# Patient Record
Sex: Male | Born: 1960
Health system: Southern US, Community
[De-identification: ages and names within clinical notes are randomized; demographics above are authoritative.]

## PROBLEM LIST (undated history)

## (undated) DIAGNOSIS — M609 Myositis, unspecified: Secondary | ICD-10-CM

## (undated) DIAGNOSIS — E785 Hyperlipidemia, unspecified: Secondary | ICD-10-CM

## (undated) HISTORY — DX: Hyperlipidemia, unspecified: E78.5

## (undated) HISTORY — DX: Myositis, unspecified: M60.9

## (undated) HISTORY — PX: APPENDECTOMY: SHX54

---

## 2005-03-07 ENCOUNTER — Ambulatory Visit: Payer: Self-pay | Admitting: Sports Medicine

## 2011-06-28 ENCOUNTER — Ambulatory Visit (INDEPENDENT_AMBULATORY_CARE_PROVIDER_SITE_OTHER): Payer: Managed Care, Other (non HMO) | Admitting: Physician Assistant

## 2011-06-28 VITALS — BP 133/70 | HR 95 | Temp 100.5°F | Resp 16 | Ht 70.5 in | Wt 205.2 lb

## 2011-06-28 DIAGNOSIS — J40 Bronchitis, not specified as acute or chronic: Secondary | ICD-10-CM

## 2011-06-28 DIAGNOSIS — R059 Cough, unspecified: Secondary | ICD-10-CM

## 2011-06-28 DIAGNOSIS — R509 Fever, unspecified: Secondary | ICD-10-CM

## 2011-06-28 DIAGNOSIS — R05 Cough: Secondary | ICD-10-CM

## 2011-06-28 LAB — POCT INFLUENZA A/B
Influenza A, POC: NEGATIVE
Influenza B, POC: NEGATIVE

## 2011-06-28 MED ORDER — AZITHROMYCIN 250 MG PO TABS: ORAL_TABLET | ORAL | Status: AC

## 2011-06-28 MED ORDER — HYDROCODONE-HOMATROPINE 5-1.5 MG/5ML PO SYRP: ORAL_SOLUTION | ORAL | Status: AC

## 2011-06-28 MED ORDER — IPRATROPIUM BROMIDE 0.06 % NA SOLN: 2.0000 | Freq: Three times a day (TID) | NASAL | Status: DC

## 2011-06-28 NOTE — Progress Notes (Signed)
Patient ID: Eric Martinez MRN: [id number], DOB: September 28, 1960, 51 y.o. Date of Encounter: 06/28/2011, 11:25 AM  Primary Physician: Juline Patch, MD, MD  Chief Complaint:  Chief Complaint  Patient presents with  . Fever    x 5 days  . Cough    dark phlegm  . Sore Throat    body ache    HPI: 51 y.o. year old male presents with a 5 day history of nasal congestion, post nasal drip, sore throat, and cough. Mild sinus pressure. Subjective fever and chills over the past two day. Initially without fever. Nasal congestion thick and green/yellow. Cough is productive of green/yellow sputum and worse in the morning. Ears feel full, leading to sensation of muffled hearing. Has tried OTC cold preps without success. No GI complaints. Appetite normal.  No sick contacts, recent antibiotics, or recent travels.   No leg trauma, sedentary periods, h/o cancer, or tobacco use.  No past medical history on file.   Home Meds: Prior to Admission medications   Medication Sig Start Date End Date Taking? Authorizing Provider  azithromycin (ZITHROMAX Z-PAK) 250 MG tablet 2 tabs po first day, then 1 tab po next 4 days 06/28/11 07/03/11  Raymon Mutton Raymundo Rout, PA-C  HYDROcodone-homatropine [facility]) 5-1.5 MG/5ML syrup 1 TSP PO Q 4-6 HOURS PRN COUGH 06/28/11 07/08/11  Steve Youngberg M Lindsie Simar, PA-C  ipratropium (ATROVENT) 0.06 % nasal spray Place 2 sprays into the nose 3 (three) times daily. 06/28/11 06/27/12  Sondra Barges, PA-C    Allergies: No Known Allergies  History   Social History  . Marital Status: Married    Spouse Name: N/A    Number of Children: N/A  . Years of Education: N/A   Occupational History  . Not on file.   Social History Main Topics  . Smoking status: Never Smoker   . Smokeless tobacco: Not on file  . Alcohol Use: Not on file  . Drug Use: Not on file  . Sexually Active: Not on file   Other Topics Concern  . Not on file   Social History Narrative  . No narrative on file     Review of  Systems: Constitutional: negative for night sweats or weight changes Cardiovascular: negative for chest pain or palpitations Respiratory: negative for hemoptysis, wheezing, or shortness of breath Abdominal: negative for abdominal pain, nausea, vomiting or diarrhea Dermatological: negative for rash Neurologic: negative for headache   Physical Exam: Blood pressure 133/70, pulse 95, temperature 100.5 F (38.1 C), temperature source Oral, resp. rate 16, height 5' 10.5" (1.791 m), weight 205 lb 3.2 oz (93.078 kg)., Body mass index is 29.03 kg/(m^2). General: Well developed, well nourished, in no acute distress. Head: Normocephalic, atraumatic, eyes without discharge, sclera non-icteric, nares are congested. Bilateral auditory canals clear, TM's are without perforation, pearly grey with reflective cone of light bilaterally. No sinus TTP. Oral cavity moist, dentition normal. Posterior pharynx with post nasal drip and mild erythema. No peritonsillar abscess or tonsillar exudate. Neck: Supple. No thyromegaly. Full ROM. No lymphadenopathy. Lungs: Coarse breath sounds bilaterally without wheezes, rales, or rhonchi. Breathing is unlabored.  Heart: RRR with S1 S2. No murmurs, rubs, or gallops appreciated. Msk:  Strength and tone normal for age. Extremities: No clubbing or cyanosis. No edema. Neuro: Alert and oriented X 3. Moves all extremities spontaneously. CNII-XII grossly in tact. Psych:  Responds to questions appropriately with a normal affect.   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 -Azithromycin 250 MG #6 2 po first day then 1 po next 4 days no RF -Atrovent NS 0.06% 2 sprays each nare bid prn #1 no RF -Hycodan #4oz 1 tsp po q 4-6 hours prn cough no RF SED -Mucinex -Tylenol/Motrin  prn -Rest/fluids -RTC precautions -RTC 3-5 days if no improvement  Signed, Eula Listen, PA-C 06/28/2011 11:25 AM

## 2012-12-01 ENCOUNTER — Ambulatory Visit (INDEPENDENT_AMBULATORY_CARE_PROVIDER_SITE_OTHER): Payer: Managed Care, Other (non HMO) | Admitting: Emergency Medicine

## 2012-12-01 VITALS — BP 120/80 | HR 70 | Temp 98.5°F | Resp 17 | Ht 70.5 in | Wt 204.0 lb

## 2012-12-01 DIAGNOSIS — IMO0002 Reserved for concepts with insufficient information to code with codable children: Secondary | ICD-10-CM

## 2012-12-01 DIAGNOSIS — M79645 Pain in left finger(s): Secondary | ICD-10-CM

## 2012-12-01 DIAGNOSIS — T148XXA Other injury of unspecified body region, initial encounter: Secondary | ICD-10-CM

## 2012-12-01 DIAGNOSIS — M79609 Pain in unspecified limb: Secondary | ICD-10-CM

## 2012-12-01 NOTE — Progress Notes (Signed)
Procedure Note: Verbal consent obtained from the patient.  Local anesthesia with 5 cc Lidocaine 2% without epinephrine.  Wound scrubbed with soap and water.  Betadine prep.  Laceration extended with 15 blade for better visualization of deep structures.  Wound explored.  No foreign bodies or deep structure injury noted.  Wound closed with #4 simple interrupted sutures of 5-0 ethilon.  Area cleansed and dressed.  Wound care discussed.  Pt tolerated very well.

## 2012-12-10 ENCOUNTER — Ambulatory Visit (INDEPENDENT_AMBULATORY_CARE_PROVIDER_SITE_OTHER): Payer: Managed Care, Other (non HMO) | Admitting: Physician Assistant

## 2012-12-10 DIAGNOSIS — Z4802 Encounter for removal of sutures: Secondary | ICD-10-CM

## 2012-12-10 NOTE — Progress Notes (Signed)
  Subjective:    Patient ID: Eric Martinez, male    DOB: June 04, 1960, 52 y.o.   MRN: [id number]  HPI   Eric Martinez is a very pleasant 52 yr old male here for removal of sutures placed here 12/01/12.  Pt reports the area has healed very well.  No further pain in the thumb.  Full ROM.  No pain, drainage from the wound.     Review of Systems  All other systems reviewed and are negative.       Objective:   Physical Exam  Vitals reviewed. Constitutional: He is oriented to person, place, and time. He appears well-developed and well-nourished. No distress.  HENT:  Head: Normocephalic and atraumatic.  Eyes: Conjunctivae are normal. No scleral icterus.  Pulmonary/Chest: Effort normal.  Musculoskeletal:  Well healed laceration of left thumb; #4 sutures removed without difficulty; thumb with full AROM and strength  Neurological: He is alert and oriented to person, place, and time.  Skin: Skin is warm and dry.  Psychiatric: He has a normal mood and affect. His behavior is normal.        Assessment & Plan:  Visit for suture removal   Eric Martinez is a very pleasant 52 yr old male here for removal of sutures.  Laceration is very well healed, and thumb has full ROM and strength.  RTC as needs arise.

## 2013-02-10 ENCOUNTER — Other Ambulatory Visit: Payer: Self-pay | Admitting: Orthopedic Surgery

## 2013-02-10 DIAGNOSIS — M25562 Pain in left knee: Secondary | ICD-10-CM

## 2013-02-11 ENCOUNTER — Ambulatory Visit
Admission: RE | Admit: 2013-02-11 | Discharge: 2013-02-11 | Disposition: A | Discharge status: Home / Self Care | Payer: 59 | Source: Ambulatory Visit | Attending: Orthopedic Surgery | Admitting: Orthopedic Surgery

## 2013-02-11 DIAGNOSIS — M25562 Pain in left knee: Secondary | ICD-10-CM

## 2013-10-09 ENCOUNTER — Ambulatory Visit (INDEPENDENT_AMBULATORY_CARE_PROVIDER_SITE_OTHER): Payer: 59 | Admitting: Family Medicine

## 2013-10-09 VITALS — BP 124/68 | HR 98 | Temp 102.2°F | Resp 16 | Ht 70.0 in | Wt 202.0 lb

## 2013-10-09 DIAGNOSIS — R197 Diarrhea, unspecified: Secondary | ICD-10-CM

## 2013-10-09 DIAGNOSIS — D696 Thrombocytopenia, unspecified: Secondary | ICD-10-CM

## 2013-10-09 DIAGNOSIS — T148 Other injury of unspecified body region: Secondary | ICD-10-CM

## 2013-10-09 DIAGNOSIS — R509 Fever, unspecified: Secondary | ICD-10-CM

## 2013-10-09 DIAGNOSIS — R51 Headache: Secondary | ICD-10-CM

## 2013-10-09 DIAGNOSIS — W57XXXA Bitten or stung by nonvenomous insect and other nonvenomous arthropods, initial encounter: Secondary | ICD-10-CM

## 2013-10-09 LAB — POCT CBC
Granulocyte percent: 83.2 %G — AB (ref 37–80)
HCT, POC: 42 % — AB (ref 43.5–53.7)
Hemoglobin: 14.4 g/dL (ref 14.1–18.1)
Lymph, poc: 0.9 (ref 0.6–3.4)
MCH, POC: 33.1 pg — AB (ref 27–31.2)
MCHC: 34.3 g/dL (ref 31.8–35.4)
MCV: 96.5 fL (ref 80–97)
MID (cbc): 0.4 (ref 0–0.9)
MPV: 9.4 fL (ref 0–99.8)
POC Granulocyte: 6.3 (ref 2–6.9)
POC LYMPH PERCENT: 11.7 %L (ref 10–50)
POC MID %: 5.1 %M (ref 0–12)
Platelet Count, POC: 101 10*3/uL — AB (ref 142–424)
RBC: 4.35 M/uL — AB (ref 4.69–6.13)
RDW, POC: 12.3 %
WBC: 7.6 10*3/uL (ref 4.6–10.2)

## 2013-10-09 MED ORDER — IBUPROFEN 200 MG PO TABS
600.0000 mg | ORAL_TABLET | Freq: Once | ORAL | Status: AC
  Administered 2013-10-09: 600 mg via ORAL

## 2013-10-09 NOTE — Progress Notes (Signed)
Chief Complaint:  Chief Complaint  Patient presents with  . Diarrhea    since Wed-has not been out of the country--not exposed to anyone--has gone 4 x today  . Fever    since Tuesday  . Headache    since Monday    HPI: Eric Martinez is a 53 y.o. male who is here for  Headache on Monday, then Tuesday had fever tmax was 102. He di not take a lot of medicines. He has only taken 2 Aleves. He had some dizziness and tiredness. No new travels, has not had any sick contacts, no new medicines, no new abx, he had a rash on the back of his  Neck  ? From sun screen. He was mountain biking. He has had ticks on him in the past  At the beginning of July 1st, he was able to pull it off when he saw it. He has not eaten anything new. He has ahd 4 episodes of diarrhea. He took pepto. Nonbloody. Water . He has city water.  NO family history of inflammatory bowel disease.  Diarrhea started Wednesday is alittle worse today.   No past medical history on file. Past Surgical History  Procedure Laterality Date  . Appendectomy     History   Social History  . Marital Status: Married    Spouse Name: N/A    Number of Children: N/A  . Years of Education: N/A   Social History Main Topics  . Smoking status: Never Smoker   . Smokeless tobacco: None  . Alcohol Use: No  . Drug Use: No  . Sexual Activity: Yes    Birth Control/ Protection: Abstinence   Other Topics Concern  . None   Social History Narrative  . None   No family history on file. No Known Allergies Prior to Admission medications   Not on File     ROS: The patient denies night sweats, unintentional weight loss, chest pain, palpitations, wheezing, dyspnea on exertion, nausea, vomiting, abdominal pain, dysuria, hematuria, melena, numbness, weakness, or tingling.  All other systems have been reviewed and were otherwise negative with the exception of those mentioned in the HPI and as above.    PHYSICAL EXAM: Filed Vitals:   10/09/13 1950  BP: 124/68  Pulse: 98  Temp: 102.2 F (39 C)  Resp: 16   Filed Vitals:   10/09/13 1950  Height: 5\' 10"  (1.778 m)  Weight: 202 lb (91.627 kg)   Body mass index is 28.98 kg/(m^2).  General: Alert, no acute distress HEENT:  Normocephalic, atraumatic, oropharynx patent. EOMI, PERRLA, no exudates, no ecchymosis, fundo exam normal Cardiovascular:  Regular rate and rhythm, no rubs murmurs or gallops.  No Carotid bruits, radial pulse intact. No pedal edema.  Respiratory: Clear to auscultation bilaterally.  No wheezes, rales, or rhonchi.  No cyanosis, no use of accessory musculature GI: No organomegaly, abdomen is soft and non-tender, positive bowel sounds.  No masses. Skin: No rashes. Neurologic: Facial musculature symmetric. UE and LE stre nl.  Psychiatric: Patient is appropriate throughout our interaction. Lymphatic: No cervical lymphadenopathy Musculoskeletal: Gait intact.   LABS: Results for orders placed in visit on 10/09/13  COMPLETE METABOLIC PANEL WITH GFR      Result Value Ref Range   Sodium 130 (*) 135 - 145 mEq/L   Potassium 4.0  3.5 - 5.3 mEq/L   Chloride 97  96 - 112 mEq/L   CO2 26  19 - 32 mEq/L   Glucose, Bld  128 (*) 70 - 99 mg/dL   BUN 14  6 - 23 mg/dL   Creat 1.35  0.50 - 1.35 mg/dL   Total Bilirubin 0.4  0.2 - 1.2 mg/dL   Alkaline Phosphatase 57  39 - 117 U/L   AST 32  0 - 37 U/L   ALT 19  0 - 53 U/L   Total Protein 7.3  6.0 - 8.3 g/dL   Albumin 4.5  3.5 - 5.2 g/dL   Calcium 9.2  8.4 - 10.5 mg/dL   GFR, Est African American 70     GFR, Est Non African American 61    ROCKY MTN SPOTTED FVR AB, IGM-BLOOD      Result Value Ref Range   ROCKY MTN SPOTTED FEVER, IGM      POCT CBC      Result Value Ref Range   WBC 7.6  4.6 - 10.2 K/uL   Lymph, poc 0.9  0.6 - 3.4   POC LYMPH PERCENT 11.7  10 - 50 %L   MID (cbc) 0.4  0 - 0.9   POC MID % 5.1  0 - 12 %M   POC Granulocyte 6.3  2 - 6.9   Granulocyte percent 83.2 (*) 37 - 80 %G   RBC 4.35 (*) 4.69 -  6.13 M/uL   Hemoglobin 14.4  14.1 - 18.1 g/dL   HCT, POC 42.0 (*) 43.5 - 53.7 %   MCV 96.5  80 - 97 fL   MCH, POC 33.1 (*) 27 - 31.2 pg   MCHC 34.3  31.8 - 35.4 g/dL   RDW, POC 12.3     Platelet Count, POC 101 (*) 142 - 424 K/uL   MPV 9.4  0 - 99.8 fL     EKG/XRAY:   Primary read interpreted by Dr. Marin Comment at [facility].   ASSESSMENT/PLAN: Encounter Diagnoses  Name Primary?  . Fever, unspecified Yes  . Headache(784.0)   . Diarrhea   . Tick bite   . Thrombocytopenia, unspecified    IVF x 1 bag Labs pending Take tylenol and motrin to break fever Temp went down to 101 after given ibuprofen D/w patient low platelets, may need to recheck  F/u prn, call with labs   Gross sideeffects, risk and benefits, and alternatives of medications d/w patient. Patient is aware that all medications have potential sideeffects and we are unable to predict every sideeffect or drug-drug interaction that may occur.  LE, Dyer, DO 10/11/2013 10:07 AM  LM about CMP, low NA

## 2013-10-10 ENCOUNTER — Telehealth: Payer: Self-pay

## 2013-10-10 LAB — COMPLETE METABOLIC PANEL WITH GFR
ALT: 19 U/L (ref 0–53)
AST: 32 U/L (ref 0–37)
Albumin: 4.5 g/dL (ref 3.5–5.2)
Alkaline Phosphatase: 57 U/L (ref 39–117)
BUN: 14 mg/dL (ref 6–23)
CO2: 26 mEq/L (ref 19–32)
Calcium: 9.2 mg/dL (ref 8.4–10.5)
Chloride: 97 mEq/L (ref 96–112)
Creat: 1.35 mg/dL (ref 0.50–1.35)
GFR, Est African American: 70 mL/min
GFR, Est Non African American: 61 mL/min
Glucose, Bld: 128 mg/dL — ABNORMAL HIGH (ref 70–99)
Potassium: 4 mEq/L (ref 3.5–5.3)
Sodium: 130 mEq/L — ABNORMAL LOW (ref 135–145)
Total Bilirubin: 0.4 mg/dL (ref 0.2–1.2)
Total Protein: 7.3 g/dL (ref 6.0–8.3)

## 2013-10-10 NOTE — Telephone Encounter (Signed)
Patient called and states he was not clear on how he should follow up from his visit last night. Please return call and advise. CB # [phone]

## 2013-10-10 NOTE — Telephone Encounter (Signed)
Lm for pt to RTC if symptoms return or get worse, we are waiting for lab results.

## 2013-10-11 ENCOUNTER — Telehealth: Payer: Self-pay | Admitting: Family Medicine

## 2013-10-11 NOTE — Telephone Encounter (Signed)
LM about CMP, Sodium  aws low, asked him to call me back on cell if have questions. His soidum was low and he states he was fine but gave him precautions over phone n/v/abd pain.confusion  Still awaiting labs for RMSF and Lyme titers

## 2013-10-13 ENCOUNTER — Encounter: Payer: Self-pay | Admitting: Family Medicine

## 2013-10-13 ENCOUNTER — Telehealth: Payer: Self-pay | Admitting: Family Medicine

## 2013-10-13 ENCOUNTER — Telehealth: Payer: Self-pay

## 2013-10-13 LAB — ROCKY MTN SPOTTED FVR AB, IGM-BLOOD: ROCKY MTN SPOTTED FEVER, IGM: 0.36 IV

## 2013-10-13 LAB — B. BURGDORFI ANTIBODIES: B burgdorferi Ab IgG+IgM: 0.39 {ISR}

## 2013-10-13 NOTE — Telephone Encounter (Signed)
Left message on machine for Eric Martinez to return my phone call.  Will advise patient that his office visit for DOS 10/09/13 will be a no charge per Dr. Marin Comment.

## 2013-10-13 NOTE — Telephone Encounter (Signed)
Spoke with patient regarding lab work since I just got the RMSF and Lyme titer back. He was  Questioning my lack of giving him abx. I attempted to explain to him that he had no white count but minimally elevated granulocyte count of 83% which could have been due to the diarrhea. We have been seeing a lot of viral GI illnesses in the office. I Was also awaiting for the RMSF and lyme titers since he had a tick on him earleir in the month and was now having HA from either dehydration or tick bite. He called our office  and a message was left for him. He tells me he was not happy with the voicemessage left by our LPN. He went to see Dr Minna Antis  On 7/25 afterwards and got a 3 day course of cipro andis today feeling better . He is a little perplexed as to why I did not give him abx. I was hoping he would get better with IVF and taking tylenol and/or motrin, bland diet  since he was not doing this at all. He did not understand this and wished he had been more forceful about getting an antibiotic. He is not happy that he has to pay for 2 doctor's visit. I gave him Candace Gallus  our office managers number to see if he can contact her for billing issues. He appreciated that I was going to do this.  I also called Alwyn Ren and asked her to not charge an OV for him and just to charge what was done without my portion of the OV.

## 2013-10-13 NOTE — Telephone Encounter (Signed)
He did not want any of his labs sent to him after we discussed it over the phone

## 2013-10-14 NOTE — Telephone Encounter (Signed)
Spoke with Mr. Eric Martinez and advised him that OV will not be charged.  Patient was very happy and thanked me for calling.

## 2013-11-06 ENCOUNTER — Other Ambulatory Visit: Payer: Self-pay | Admitting: Rheumatology

## 2013-11-06 DIAGNOSIS — M6281 Muscle weakness (generalized): Secondary | ICD-10-CM

## 2013-11-09 ENCOUNTER — Ambulatory Visit
Admission: RE | Admit: 2013-11-09 | Discharge: 2013-11-09 | Disposition: A | Discharge status: Home / Self Care | Payer: 59 | Source: Ambulatory Visit | Attending: Rheumatology | Admitting: Rheumatology

## 2013-11-09 DIAGNOSIS — M6281 Muscle weakness (generalized): Secondary | ICD-10-CM

## 2013-11-13 ENCOUNTER — Ambulatory Visit (INDEPENDENT_AMBULATORY_CARE_PROVIDER_SITE_OTHER): Payer: 59 | Admitting: General Surgery

## 2013-11-13 ENCOUNTER — Encounter (INDEPENDENT_AMBULATORY_CARE_PROVIDER_SITE_OTHER): Payer: Self-pay | Admitting: General Surgery

## 2013-11-13 VITALS — BP 122/82 | HR 75 | Temp 97.4°F | Ht 72.0 in | Wt 194.0 lb

## 2013-11-13 DIAGNOSIS — M3390 Dermatopolymyositis, unspecified, organ involvement unspecified: Secondary | ICD-10-CM

## 2013-11-13 NOTE — Progress Notes (Signed)
Chief complaint: Muscle weakness, possible myositis  History: Patient is a very pleasant 53 year old male referred by Dr. Gavin Pound for consideration for a muscle biopsy. The patient had an episode of fever and a rash in July. Following this he developed progressive muscle stiffness and soreness which he notes somewhat diffusely. Also some difficulty swallowing. He was seen by Dr. Trudie Reed and concern was raised for dermatomyositis. He has had a skin biopsy that is pending. CK was elevated at 2900. He has had an MRI revealing hyperintensity and enhancement involving multiple muscles particularly in the proximal thighs more so on the right supportive of an inflammatory myopathy.   History reviewed. No pertinent past medical history. Past Surgical History  Procedure Laterality Date  . Appendectomy     Current Outpatient Prescriptions  Medication Sig Dispense Refill  . predniSONE (DELTASONE) 10 MG tablet Take 10 mg by mouth daily with breakfast.       No current facility-administered medications for this visit.   No Known Allergies  Exam: BP 122/82  Pulse 75  Temp(Src) 97.4 F (36.3 C)  Ht 6' (1.829 m)  Wt 194 lb (87.998 kg)  BMI 26.31 kg/m2 General: Well-appearing male in no distress Skin: No obvious rash today Lungs: No wheezing or increased work of breathing Extremities: No edema deformity or evidence of infection Neurologic: Alert and fully oriented. Gait appears normal. 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 Muscle biopsy has been recommended. I discussed the procedure with the patient today including its indications and nature and possible risks of bleeding and infection. He was given literature regarding the procedure. We'll schedule this under local anesthesia as quickly as possible

## 2013-11-13 NOTE — Patient Instructions (Signed)
Muscle Biopsy A muscle biopsy is a procedure in which a tissue sample is removed from a muscle. The tissue is examined under a microscope to help detect health problems that may involve the muscles. Chemical tests can also be run on the sample if they are needed. A muscle biopsy can be used to diagnose various problems, including muscular disorders (such as muscular dystrophy), infections, diseases that affect connective tissue or blood vessels, and other defects in the muscle. LET YOUR CAREGIVER KNOW ABOUT:  Any allergies you have.   All medicines you are taking, including vitamins, steroids, herbs, eyedrops, and over-the-counter medicines and creams.   Previous problems you or members of your family have had with the use of anesthetics.   Possibility of pregnancy, if this applies.   Any blood disorders you have.  Previous surgeries you have had.   Other health problems you have.  RISKS AND COMPLICATIONS Generally, muscle biopsy is a safe procedure. However, as with any surgical procedure, complications can occur. Possible complications include:  Bruising.  Bleeding from the biopsy site.  Infection.  Problems healing the wound.  Injury to the muscle tissue or other tissue near the biopsy site. BEFORE THE PROCEDURE   Ask your caregiver about changing or stopping your regular medicines.  Make plans to have someone drive you home after the procedure. PROCEDURE  You will be given a medicine to numb the area where the biopsy sample will be taken (local anesthetic). You may also be given a medicine to help you relax (sedative). The biopsy site will be cleaned with a germ-killing solution. One of the following methods will then be used to remove the tissue sample:  Needle biopsy: A biopsy needle is inserted into the muscle. The needle is used to collect the tissue sample. A bandage (dressing) may then be put over the biopsy site.  Open biopsy: A small cut (incision) is made in  the skin and muscle. The tissue sample is then removed using surgical tools. The incision is closed with skin glue, skin adhesive strips, or stitches if needed. AFTER THE PROCEDURE  Your recovery will be assessed and monitored. If there are no problems, you will be allowed to go home shortly after the procedure. You may have soreness and tenderness at the site of the biopsy for a few days after the procedure. Document Released: 06/12/2000 Document Revised: 02/21/2012 Document Reviewed: 01/17/2012 [facility] Patient Information 2015 [place], [place]. This information is not intended to replace advice given to you by your health care provider. Make sure you discuss any questions you have with your health care provider.

## 2013-11-18 ENCOUNTER — Ambulatory Visit (HOSPITAL_BASED_OUTPATIENT_CLINIC_OR_DEPARTMENT_OTHER)
Admission: RE | Admit: 2013-11-18 | Discharge: 2013-11-18 | Disposition: A | Discharge status: Home / Self Care | Payer: 59 | Source: Ambulatory Visit | Attending: General Surgery | Admitting: General Surgery

## 2013-11-18 ENCOUNTER — Encounter (HOSPITAL_BASED_OUTPATIENT_CLINIC_OR_DEPARTMENT_OTHER)
Admission: RE | Disposition: A | Discharge status: Home / Self Care | Payer: Self-pay | Source: Ambulatory Visit | Attending: General Surgery

## 2013-11-18 ENCOUNTER — Encounter (HOSPITAL_BASED_OUTPATIENT_CLINIC_OR_DEPARTMENT_OTHER): Payer: Self-pay | Admitting: *Deleted

## 2013-11-18 DIAGNOSIS — IMO0001 Reserved for inherently not codable concepts without codable children: Secondary | ICD-10-CM | POA: Insufficient documentation

## 2013-11-18 DIAGNOSIS — M609 Myositis, unspecified: Secondary | ICD-10-CM

## 2013-11-18 HISTORY — DX: Myositis, unspecified: M60.9

## 2013-11-18 HISTORY — PX: MINOR MUSCLE BIOPSY: SHX6352

## 2013-11-18 SURGERY — Surgical Case
Anesthesia: *Unknown

## 2013-11-18 SURGERY — MINOR MUSCLE BIOPSY
Anesthesia: LOCAL | Site: Thigh | Laterality: Right

## 2013-11-18 MED ORDER — HYDROCODONE-ACETAMINOPHEN 5-325 MG PO TABS: 1.0000 | ORAL_TABLET | ORAL | Status: DC | PRN

## 2013-11-18 MED ORDER — SODIUM BICARBONATE 4 % IV SOLN
INTRAVENOUS | Status: DC | PRN
  Administered 2013-11-18: 17:00:00

## 2013-11-18 MED ORDER — LIDOCAINE HCL (PF) 1 % IJ SOLN
INTRAMUSCULAR | Status: AC
  Filled 2013-11-18: qty 30

## 2013-11-18 MED ORDER — SODIUM BICARBONATE 4 % IV SOLN
INTRAVENOUS | Status: AC
  Filled 2013-11-18: qty 5

## 2013-11-18 MED ORDER — BUPIVACAINE-EPINEPHRINE (PF) 0.25% -1:200000 IJ SOLN
INTRAMUSCULAR | Status: AC
  Filled 2013-11-18: qty 30

## 2013-11-18 SURGICAL SUPPLY — 29 items
BLADE CLIPPER SURG (BLADE) ×2 IMPLANT
BLADE SURG 15 STRL LF DISP TIS (BLADE) ×1 IMPLANT
BLADE SURG 15 STRL SS (BLADE) ×1
CHLORAPREP W/TINT 26ML (MISCELLANEOUS) ×2 IMPLANT
DEPRESSOR TONGUE BLADE STERILE (MISCELLANEOUS) ×2 IMPLANT
DERMABOND ADVANCED (GAUZE/BANDAGES/DRESSINGS) ×1
DERMABOND ADVANCED .7 DNX12 (GAUZE/BANDAGES/DRESSINGS) ×1 IMPLANT
ELECT REM PT RETURN 9FT ADLT (ELECTROSURGICAL)
ELECTRODE REM PT RTRN 9FT ADLT (ELECTROSURGICAL) IMPLANT
GLOVE BIO SURGEON STRL SZ7.5 (GLOVE) ×2 IMPLANT
GLOVE BIOGEL PI IND STRL 8 (GLOVE) ×2 IMPLANT
GLOVE BIOGEL PI INDICATOR 8 (GLOVE) ×2
GLOVE SS BIOGEL STRL SZ 7.5 (GLOVE) ×1 IMPLANT
GLOVE SUPERSENSE BIOGEL SZ 7.5 (GLOVE) ×1
GOWN STRL REUS W/ TWL XL LVL3 (GOWN DISPOSABLE) ×2 IMPLANT
GOWN STRL REUS W/TWL XL LVL3 (GOWN DISPOSABLE) ×2
NDL SAFETY ECLIPSE 18X1.5 (NEEDLE) IMPLANT
NEEDLE HYPO 18GX1.5 SHARP (NEEDLE)
NEEDLE HYPO 25X1 1.5 SAFETY (NEEDLE) ×2 IMPLANT
NS IRRIG 1000ML POUR BTL (IV SOLUTION) ×2 IMPLANT
PENCIL BUTTON HOLSTER BLD 10FT (ELECTRODE) IMPLANT
SPONGE GAUZE 4X4 12PLY STER LF (GAUZE/BANDAGES/DRESSINGS) ×2 IMPLANT
SUT MON AB 4-0 PC3 18 (SUTURE) ×2 IMPLANT
SUT VIC AB 3-0 SH 27 (SUTURE) ×1
SUT VIC AB 3-0 SH 27X BRD (SUTURE) ×1 IMPLANT
SYR CONTROL 10ML LL (SYRINGE) ×2 IMPLANT
TOWEL OR 17X24 6PK STRL BLUE (TOWEL DISPOSABLE) IMPLANT
TOWEL OR NON WOVEN STRL DISP B (DISPOSABLE) IMPLANT
TRAY DSU PREP LF (CUSTOM PROCEDURE TRAY) IMPLANT

## 2013-11-18 NOTE — H&P (View-Only) (Signed)
Chief complaint: Muscle weakness, possible myositis  History: Patient is a very pleasant 52 year old male referred by Dr. Gavin Pound for consideration for a muscle biopsy. The patient had an episode of fever and a rash in July. Following this he developed progressive muscle stiffness and soreness which he notes somewhat diffusely. Also some difficulty swallowing. He was seen by Dr. Trudie Reed and concern was raised for dermatomyositis. He has had a skin biopsy that is pending. CK was elevated at 2900. He has had an MRI revealing hyperintensity and enhancement involving multiple muscles particularly in the proximal thighs more so on the right supportive of an inflammatory myopathy.   History reviewed. No pertinent past medical history. Past Surgical History  Procedure Laterality Date  . Appendectomy     Current Outpatient Prescriptions  Medication Sig Dispense Refill  . predniSONE (DELTASONE) 10 MG tablet Take 10 mg by mouth daily with breakfast.       No current facility-administered medications for this visit.   No Known Allergies  Exam: BP 122/82  Pulse 75  Temp(Src) 97.4 F (36.3 C)  Ht 6' (1.829 m)  Wt 194 lb (87.998 kg)  BMI 26.31 kg/m2 General: Well-appearing male in no distress Skin: No obvious rash today Lungs: No wheezing or increased work of breathing Extremities: No edema deformity or evidence of infection Neurologic: Alert and fully oriented. Gait appears normal. 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 Muscle biopsy has been recommended. I discussed the procedure with the patient today including its indications and nature and possible risks of bleeding and infection. He was given literature regarding the procedure. We'll schedule this under local anesthesia as quickly as possible

## 2013-11-18 NOTE — Op Note (Signed)
Preoperative Diagnosis: myositis  Postoprative Diagnosis: myositis  Procedure: Procedure(s):  MUSCLE BIOPSY right thigh   Surgeon: Excell Seltzer T   Assistants: none  Anesthesia:  Local anesthesia 2% buffered lidocaine, 0.5% bupivacaine  Indications: patient is a 53 year old male who presents with generalized muscle weakness. Workup has indicated possible myositis particularly involving the proximal muscles of the thighs greater on the right than the left. Right thigh muscle biopsy has been requested by rheumatology. I discussed the indications in nature the procedure with him including risks of bleeding infection wound healing problems and he is in agreement.  Procedure Detail:  Patient brought to the operating room and placed in the supine position on the operating table.  The proximal medial right thigh was sterilely prepped and draped. Local anesthesia was used to infiltrate the skin and underlying soft tissues. I made a longitudinal incision about 2-3 cm in length and dissection was carried down sharply through the subcutaneous tissue. The fascia was exposed and incised longitudinally. I then sharply excised an approximately 2-1/2 cm length of bundled muscle fibers. These were sent in saline immediately to the catheter lab. There was no bleeding. The fascia and the subcutaneous tissues closed with running 3-0 Vicryl. Skin was closed with subcuticular 4-0 Monocryl and Dermabond.          Specimens: muscle biopsy proximal right thigh        Complications:  * No complications entered in OR log *         Disposition: Short Stay         Condition: stable

## 2013-11-18 NOTE — Interval H&P Note (Signed)
History and Physical Interval Note:  11/18/2013 4:17 PM  Eric Martinez  has presented today for surgery, with the diagnosis of myositis  The various methods of treatment have been discussed with the patient and family. After consideration of risks, benefits and other options for treatment, the patient has consented to  Procedure(s): MINOR MUSCLE BIOPSY (Right) as a surgical intervention .  The patient's history has been reviewed, patient examined, no change in status, stable for surgery.  I have reviewed the patient's chart and labs.  Questions were answered to the patient's satisfaction.     Burney Calzadilla T

## 2013-11-18 NOTE — Discharge Instructions (Signed)
Keep incision dry overnight. May shower tomorrow. Leave glue dressing alone and he will peel off in one to 2 weeks. I will call you when I see results of the biopsy. If you have no concerns about your wound in 2 weeks you may skip your postop appointment. Please call and let us know. Call as needed for excessive pain, drainage, redness or other concerns.   [phone]

## 2013-11-19 ENCOUNTER — Encounter (HOSPITAL_BASED_OUTPATIENT_CLINIC_OR_DEPARTMENT_OTHER): Payer: Self-pay | Admitting: General Surgery

## 2013-11-21 ENCOUNTER — Ambulatory Visit (INDEPENDENT_AMBULATORY_CARE_PROVIDER_SITE_OTHER): Payer: 59 | Admitting: General Surgery

## 2013-11-27 ENCOUNTER — Other Ambulatory Visit (HOSPITAL_COMMUNITY): Payer: Self-pay | Admitting: Rheumatology

## 2013-11-27 DIAGNOSIS — R1310 Dysphagia, unspecified: Secondary | ICD-10-CM

## 2013-12-02 ENCOUNTER — Encounter (HOSPITAL_COMMUNITY): Payer: Self-pay

## 2013-12-05 ENCOUNTER — Ambulatory Visit (HOSPITAL_COMMUNITY)
Admission: RE | Admit: 2013-12-05 | Discharge: 2013-12-05 | Disposition: A | Discharge status: Home / Self Care | Payer: 59 | Source: Ambulatory Visit | Attending: Rheumatology | Admitting: Rheumatology

## 2013-12-05 DIAGNOSIS — R1310 Dysphagia, unspecified: Secondary | ICD-10-CM | POA: Diagnosis present

## 2013-12-05 DIAGNOSIS — R1313 Dysphagia, pharyngeal phase: Secondary | ICD-10-CM | POA: Insufficient documentation

## 2013-12-05 DIAGNOSIS — R1319 Other dysphagia: Secondary | ICD-10-CM | POA: Insufficient documentation

## 2013-12-05 DIAGNOSIS — R1311 Dysphagia, oral phase: Secondary | ICD-10-CM | POA: Insufficient documentation

## 2013-12-05 NOTE — Procedures (Signed)
Objective Swallowing Evaluation: Modified Barium Swallowing Study  Patient Details  Name: Eric Martinez MRN: [id number] Date of Birth: Dec 23, 1960  Today's Date: 12/05/2013 Time: 1330-1410 SLP Time Calculation (min): 40 min  Past Medical History: No past medical history on file. Past Surgical History:  Past Surgical History  Procedure Laterality Date  . Appendectomy    . 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  Pt PMH + for 09/21/13 tick bite, fever, headache, diarrhea and thrombocytopenia.  Spouse states pt was never the same after tick bite and has muscle weakness.  Pt reports sensation of food sticking in throat, increased secretions and cough with intake. Current diet is regular/nectar/thin - pt consumes smoothies but complains about sensation of increased secretions.     20 pound weight loss reported in one month, dysphagia reported to start approximately 3 weeks ago and has been progressive.       Assessment / Plan / Recommendation Clinical Impression  Dysphagia Diagnosis: Severe pharyngeal phase dysphagia;Severe cervical esophageal phase dysphagia;Moderate oral phase dysphagia  Clinical impression:   Moderate oral dysphagia characterized by vp incompetence noted. Severe pharyngeal and cervical esophageal dysphagia with weak striated musculature that may be consistent with myositis.    Severely impaired muscle contraction resulting in poor laryngeal elevation/closure, tongue base retraction with severe pharyngeal residuals across all consistencies tested (puree - (1/4 tsp amount), nectar, thin) without pt awareness.  Pt conducted approx 4-6 swallows with each bolus swallowed to attempt to clear pharynx as he observed  stasis on screen.  He had poor sensation to gross pharyngeal stasis, ? Due to desensitizing, secretion retention and/or neurological.      Various postures including chin tuck/head turn not helpful to prevent residuals.  Pt was able to expectorate to remove vallecular stasis per SLP cue.    No aspiration observed however gross stasis makes him high risk.  Single episode of trace penetration with thin was cleared with reflexive throat clearing.    Pt noted to throat clear throughout entire MBS - suspect due to secretion retention in larynx, pharynx.    SLP did not test solid - cracker or pill- due to aspiration concerns.    At current level of dysphagia with gross pharyngeal stasis, pt is at very high aspiration and malnutrition risk.  Suspect pt has been having aspiration given pt self report of coughing with intake.    Advised spouse/pt to call referring MD regarding test results as instrumental results are significantly worse than pt's symptoms.  Further advised strict aspiration precautions, provided spouse with heimlich maneuver.    Treatment Recommendation  Other (Comment) (defer to referring MD)    Diet Recommendation Thin liquid;Nectar-thick liquid (moist foods masticated to mush may clear better  Pt states protein shakes increase mucus therefore SLP provided pt with two boxes of Boost Breeze to try  Liquid Administration via: Cup;Straw Medication Administration:  (crush or liquid form) Supervision: Patient able to self feed Compensations: Slow rate;Small sips/bites;Follow solids with liquid;Multiple dry swallows after each bite/sip (cough and expectorate if reflexively cough and rest, small amounts frequently) Postural Changes and/or Swallow Maneuvers: Seated upright 90 degrees;Upright 30-60 min after meal    Other  Recommendations Recommended Consults:  (dietician consult to maximize nutrition  with liquids, ? 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  Pt PMH + for 09/21/13 tick bite, fever, headache, diarrhea and thrombocytopenia.  Spouse states pt was never the same after tick bite and has muscle weakness.  Pt reports sensation of food sticking in throat, increased secretions and cough with intake. Current diet is regular/nectar/thin - pt consumes smoothies but complains about sensation of increased secretions.      Type of Study: Modified Barium Swallowing Study Reason for Referral: Objectively evaluate swallowing function Diet Prior to this Study: Dysphagia 3 (soft);Thin liquids;Nectar-thick liquids Temperature Spikes Noted: No Respiratory Status: Room air History of Recent Intubation: No Behavior/Cognition: Alert;Cooperative;Pleasant mood Oral Cavity - Dentition: Adequate natural dentition Oral Motor / Sensory Function:  (hypernasal speech indicative of vp incompetence, articulation clear, no other focal oral deficits) Self-Feeding Abilities: Able to feed self Patient Positioning: Upright in bed Baseline Vocal Quality:  (weak, chronic throat clearing noted) Volitional Cough: Strong Volitional Swallow: Able to elicit Anatomy: Within functional limits Pharyngeal Secretions: Standing secretions in (comment) (secretions appeared in pharynx *mixed with barium* that did not clear during testing)    Reason for Referral Objectively evaluate swallowing function   Oral Phase Oral Preparation/Oral Phase Oral Phase: Impaired Oral - Nectar Oral - Nectar Teaspoon: Decreased velopharyngeal closure;Reduced posterior propulsion Oral - Nectar Cup: Decreased velopharyngeal  closure;Reduced posterior propulsion Oral - Thin Oral - Thin Teaspoon: Decreased velopharyngeal closure;Reduced posterior propulsion Oral - Thin Cup: Decreased velopharyngeal closure;Reduced posterior propulsion Oral - Thin Straw: Decreased velopharyngeal closure;Reduced posterior propulsion Oral - Solids Oral - Puree: Reduced posterior propulsion;Decreased velopharyngeal closure Oral - Mechanical Soft: Not tested Oral - Pill: Not tested Oral Phase - Comment Oral Phase - Comment: did not test solid/pill due to aspiration risk    Pharyngeal Phase Pharyngeal Phase Pharyngeal Phase: Impaired Pharyngeal - Nectar Pharyngeal - Nectar Teaspoon: Premature spillage to valleculae;Compensatory strategies attempted (Comment);Pharyngeal residue - valleculae;Pharyngeal residue - pyriform sinuses;Reduced anterior laryngeal mobility;Reduced laryngeal elevation;Reduced epiglottic inversion;Reduced pharyngeal peristalsis;Reduced airway/laryngeal closure Pharyngeal - Nectar Cup: Premature spillage to pyriform sinuses;Compensatory strategies attempted (Comment);Pharyngeal residue - valleculae;Pharyngeal residue - pyriform sinuses;Reduced pharyngeal peristalsis;Reduced epiglottic inversion;Reduced anterior laryngeal mobility;Reduced laryngeal elevation;Reduced airway/laryngeal closure Pharyngeal - Thin Pharyngeal - Thin Teaspoon: Premature spillage to valleculae;Reduced tongue base retraction;Reduced pharyngeal peristalsis;Reduced epiglottic inversion;Reduced anterior laryngeal mobility;Reduced laryngeal elevation;Reduced airway/laryngeal closure;Compensatory strategies attempted (Comment);Pharyngeal residue - pyriform sinuses;Pharyngeal residue - valleculae Pharyngeal - Thin Cup: Premature spillage to valleculae;Reduced tongue base retraction;Reduced pharyngeal peristalsis;Reduced epiglottic inversion;Reduced anterior laryngeal mobility;Reduced laryngeal elevation;Reduced airway/laryngeal closure;Pharyngeal residue -  valleculae;Pharyngeal residue - pyriform sinuses;Penetration/Aspiration after swallow Penetration/Aspiration details (thin cup): Material enters airway, CONTACTS cords then ejected out Pharyngeal - Thin Straw: Premature spillage to valleculae;Reduced pharyngeal peristalsis;Reduced tongue base retraction;Reduced airway/laryngeal closure;Compensatory strategies attempted (Comment);Reduced epiglottic inversion;Reduced anterior laryngeal mobility;Reduced laryngeal elevation Pharyngeal - Solids Pharyngeal - Puree: Premature spillage to valleculae;Reduced pharyngeal peristalsis;Reduced tongue base retraction;Reduced airway/laryngeal closure;Compensatory strategies attempted (Comment);Pharyngeal residue - valleculae;Pharyngeal residue - pyriform sinuses;Reduced epiglottic inversion;Reduced anterior laryngeal mobility;Reduced laryngeal elevation (1st swallow resulted in pt not moving any puree through pharynx, gross stasis, following solids with liquids  faciliated clearance as well as expectoration) Pharyngeal - Regular: Not tested Pharyngeal - Pill: Not tested Pharyngeal Phase - Comment  Pharyngeal Comment: multiple swallows across consistencies decreases pharyngeal residuals but does not fully clear them, following solids with liquids helpful to decrease residuals, chin tuck, head turn right/left not helpful, pt noted to have more clearance of thin barium via right side in A-P view  Cervical Esophageal Phase    GO    Cervical Esophageal Phase Cervical Esophageal Phase: Impaired Cervical Esophageal Phase - Nectar Nectar Teaspoon: Reduced cricopharyngeal relaxation Cervical Esophageal Phase - Thin Thin Teaspoon: Reduced cricopharyngeal relaxation Thin Cup: Reduced cricopharyngeal relaxation Cervical Esophageal Phase - Solids Puree: Reduced cricopharyngeal relaxation Regular: Reduced cricopharyngeal relaxation Pill: Reduced cricopharyngeal relaxation  Esophageal sweep appeared with adequate  clearance distally-radiologist not present to confirm.    Functional Assessment Tool Used: mbs, clinical judgement Functional Limitations: Swallowing Swallow Current Status (R0076): At least 80 percent but less than 100 percent impaired, limited or restricted Swallow Goal Status [phone]): At least 80 percent but less than 100 percent impaired, limited or restricted Swallow Discharge Status [phone]): At least 80 percent but less than 100 percent impaired, limited or restricted    Claudie Fisherman, [facility] SLP [phone]

## 2013-12-09 ENCOUNTER — Ambulatory Visit (HOSPITAL_COMMUNITY): Payer: 59

## 2013-12-09 ENCOUNTER — Other Ambulatory Visit (HOSPITAL_COMMUNITY): Payer: 59

## 2013-12-15 ENCOUNTER — Telehealth: Payer: Self-pay | Admitting: Neurology

## 2013-12-15 NOTE — Telephone Encounter (Signed)
Pt resch appt to 12-17-13 at 10:00 from 12-19-13

## 2013-12-16 ENCOUNTER — Encounter: Payer: Self-pay | Admitting: *Deleted

## 2013-12-17 ENCOUNTER — Encounter: Payer: Self-pay | Admitting: Neurology

## 2013-12-17 ENCOUNTER — Ambulatory Visit (INDEPENDENT_AMBULATORY_CARE_PROVIDER_SITE_OTHER): Payer: 59 | Admitting: Neurology

## 2013-12-17 VITALS — BP 118/80 | HR 93 | Ht 70.87 in | Wt 173.5 lb

## 2013-12-17 DIAGNOSIS — R0602 Shortness of breath: Secondary | ICD-10-CM

## 2013-12-17 DIAGNOSIS — R471 Dysarthria and anarthria: Secondary | ICD-10-CM

## 2013-12-17 DIAGNOSIS — R1310 Dysphagia, unspecified: Secondary | ICD-10-CM

## 2013-12-17 DIAGNOSIS — R634 Abnormal weight loss: Secondary | ICD-10-CM

## 2013-12-17 DIAGNOSIS — M6281 Muscle weakness (generalized): Secondary | ICD-10-CM

## 2013-12-17 DIAGNOSIS — R748 Abnormal levels of other serum enzymes: Secondary | ICD-10-CM

## 2013-12-17 MED ORDER — GLYCOPYRROLATE 0.4 MG/2ML IJ SOLN: 1.0000 mL | Freq: Every evening | INTRAMUSCULAR | Status: DC | PRN

## 2013-12-17 NOTE — Progress Notes (Signed)
[facility] Clinic Note - Initial Visit   Date: 12/17/2013  Eric Martinez MRN: [id number] DOB: February 21, 1961   Dear Dr. Trudie Reed:   Thank you for your kind referral of Eric Martinez for consultation of progressive generalized weakness and dysphasia. Although his history is well known to you, please allow [place] to reiterate it for the purpose of our medical record. The patient was accompanied to the clinic by wife who also provides collateral information.     History of Present Illness: Eric Martinez is a 53 y.o. right-handed Caucasian male with history of hyperlipidemia referred by Dr. Gavin Pound for evaluation of progressive muscle weakness and dysphagia in the setting of elevated CK.  Patient was in his usual state of good and active health until July 2015 when he developed viral illness manifesting with fever (103 F), diarrhea. He was given 3-day course of ciprofloxacin.  He had a raised hives over the neck and spread over his scalp which preceded the illness. He had skin biopsy by dermatology which was consistent with contact dermatitis and improved with steroids. He also developed swelling of the left eye without associated eye pain or vision problems.  In early August, he started experiencing painless weakness of the arms and back.  He had difficulty raising arms.   He was referred to rheumatology after his CK was found to be elevated at 2900.  Dr. 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  Due to clinical suspicion of dermatomyositis, he underwent right thigh muscle biopsy which returned normal. Due to ongoing weakness, he was started on prednisone 60m in mid-August and was increased to 884mdaily in late August. Repeat CK was down trending, so he was started on Imuran on 9/24.    Despite being on high-dose prednisone, he continues to have step-wise progression of weakness and now reports to loosing his voice in early September.  He has problems with swallowing, especially solids and endorses choking spells.  He sleeps on 3-pilloss and uses a wedge support for the past month.  Denies any muscle twitches, cramps, numbness/tingling, changes in vision.  He also complains of oral ulcerations after starting prednisone.  [facility]   Out-side paper records, electronic medical record, and images have been reviewed where available and summarized as:  MRI right femur wwo contrast 11/09/2013: There is fairly symmetric T2 hyperintensity and enhancement involving multiple muscles in the lower pelvis and proximal thighs supportive of inflammatory myopathy. No evidence of abscess or osteomyelitis.  Muscle biopsy right thigh 11/18/2013: Normal  Labs 12/09/2013: WBC 12, hemoglobin 12.9, platelet 158, CK 1426, Labs 9/90/20/15: CK 2013, CRP 0.8, aldolase 16.1, ALT 58, AST 165  Labs 11/05/18/15: CK 2983, CRP 1.5, ESR 5, aldolase 16.3, ALT 83, AST 222, SPEP/UPEP with IFE no, hepatitis screen negative, MPO antibodies, c-ANCA p-ANCA antibodies negative, TB QuantiFERON negative, myositis panel negative, RNP ability ANA negative, ENA negative TSH 2.5, PSA 0.6  Past Medical History  Diagnosis Date  . Hyperlipidemia   . Myositis     Past Surgical History  Procedure Laterality Date  . Appendectomy    . Minor muscle biopsy Right 11/18/2013    Procedure: MINOR MUSCLE BIOPSY;  Surgeon: BeEdward JollyMD;  Location: [facility] Service: General;  Laterality: Right;     Medications:  Current  Outpatient Prescriptions on File Prior to Visit  Medication Sig Dispense Refill  . Multiple Vitamin (MULTIVITAMIN) tablet Take 1 tablet by mouth daily.      . predniSONE (DELTASONE) 20 MG tablet Take 60 mg by mouth daily with  breakfast.       No current facility-administered medications on file prior to visit.    Allergies: No Known Allergies  Family History: Family History  Problem Relation Age of Onset  . Pancreatic cancer Father   . Alzheimer's disease Mother     Social History: History   Social History  . Marital Status: Married    Spouse Name: N/A    Number of Children: N/A  . Years of Education: N/A   Occupational History  . Not on file.   Social History Main Topics  . Smoking status: Former Smoker -- 5 years  . Smokeless tobacco: Never Used  . Alcohol Use: Yes  . Drug Use: No  . Sexual Activity: Yes    Birth Control/ Protection: Abstinence   Other Topics Concern  . Not on file   Social History Narrative   Lives with wife in a one story home.   4 year college degree.   Works in [employer].             Review of Systems:  CONSTITUTIONAL: No fevers, chills, night sweats, + 30lb weight loss.   EYES: No visual changes or eye pain ENT: No hearing changes.  No history of nose bleeds.   RESPIRATORY: No cough, wheezing and shortness of breath.   CARDIOVASCULAR: Negative for chest pain, and palpitations.   GI: Negative for abdominal discomfort, blood in stools or black stools.  No recent change in bowel habits.   GU:  No history of incontinence.   MUSCLOSKELETAL: No history of joint pain or swelling.  +myalgias.   SKIN: Negative for lesions, rash, and itching.   HEMATOLOGY/ONCOLOGY: Negative for prolonged bleeding, bruising easily, and swollen nodes.  No history of cancer.   ENDOCRINE: Negative for cold or heat intolerance, polydipsia or goiter.   PSYCH:  No depression or anxiety symptoms.   NEURO: As Above.   Vital Signs:  BP 118/80  Pulse 93  Ht 5' 10.87" (1.8 m)  Wt 173 lb 8 oz (78.699 kg)  BMI 24.29 kg/m2  SpO2 97%   General Medical Exam:   General:  Well appearing, comfortable.   Eyes/ENT: see cranial nerve examination.   Neck: No masses appreciated.  Full range of  motion without tenderness.  No carotid bruits. Respiratory:  Clear to auscultation, good air entry bilaterally.  He is able to count to 22 on deep inhalation  Back:  No pain to palpation of spinous processes.   Extremities:  No deformities, edema, or skin discoloration. Good capillary refill.   Skin:  Skin color, texture, turgor normal. No rashes or lesions.  Neurological Exam: MENTAL STATUS including orientation to time, place, person, recent and remote memory, attention span and concentration, language, and fund of knowledge is normal.  Speech is mild-moderately dysarthric with hypernasal quality. He is unable to enunciate lingual and guttural sounds accurately.  CRANIAL NERVES: II:  No visual field defects.  Unremarkable fundi.   III-IV-VI: Pupils equal round and reactive to light.  Normal conjugate, extra-ocular eye movements in all directions of gaze.  No nystagmus.  No ptosis.   V:  Normal facial sensation.  Jaw jerk is present.   VII:  Mild facial diplegia at baseline, smile appears symmetric. Orbicularis oris  and orbicularis oculi muscles are 5/5. There is weakness of the buccinator muscle as evidenced by inability to maintain air in puffed cheeks.  Snout reflex is present. Myerson sign is absent. VIII:  Normal hearing and vestibular function.   IX-X:  Normal palatal movement.   XI:  Normal shoulder shrug and head rotation.   XII:  Normal tongue strength and range of motion, no deviation or fasciculation.  MOTOR:  Generalized loss of muscle bulk, however there is no focal atrophy, fasciculations or abnormal movements.  No pronator drift.  Tone is normal.    Right Upper Extremity:    Left Upper Extremity:    Deltoid  4/5   Deltoid  4/5   Biceps  4/5   Biceps  4/5   Triceps  4/5   Triceps  4/5   Wrist extensors  4/5   Wrist extensors  5/5   Wrist flexors  5/5   Wrist flexors  5/5   Finger extensors  4-/5   Finger extensors  4-/5   Finger flexors  5/5   Finger flexors  5/5   Dorsal  interossei  4/5   Dorsal interossei  4/5   Abductor pollicis  5/5   Abductor pollicis  5/5   Tone (Ashworth scale)  0  Tone (Ashworth scale)  0   Right Lower Extremity:    Left Lower Extremity:    Hip flexors  4-/5   Hip flexors  4-/5   Hip extensors  4+/5   Hip extensors  4+/5   Abductor  5/5  Abductor 5/5  Adductor  5/5  Adductor 5/5  Knee flexors  5/5   Knee flexors  5/5   Knee extensors  5/5   Knee extensors  5/5   Dorsiflexors  5/5   Dorsiflexors  5/5   Plantarflexors  5/5   Plantarflexors  5/5   Toe extensors  5/5   Toe extensors  5/5   Toe flexors  5/5   Toe flexors  5/5   Tone (Ashworth scale)  0  Tone (Ashworth scale)  0   MSRs:  Right                                                                 Left brachioradialis 1+  brachioradialis 1+  biceps 1+  biceps 1+  triceps 1+  triceps 1+  patellar 3+  patellar 2+  ankle jerk 2+  ankle jerk 2+  Hoffman no  Hoffman no  plantar response up  plantar response down   SENSORY:  Normal and symmetric perception of light touch, pinprick, vibration, and proprioception.  Romberg's sign absent.   COORDINATION/GAIT: Normal finger-to- nose-finger and heel-to-shin.  Intact rapid alternating movements bilaterally.  Able to rise from a chair without using arms.  Gait narrow based and stable. Tandem and stressed gait intact. He is able to perform squats.   IMPRESSION: Mr. Margraf is a 53 year-old gentleman presenting for evaluation of progressive generalized muscle weakness, dysphagia, dysarthria, and elevated CK.  His neurological examination is notable for generalized weakness involving the proximal muscles of the arms and legs as well as distal hand muscles, especially finger extensors. Finger flexors are intact. He also has notable bulbar weakness and associated pathological facial reflexes. Additionally, his reflexes  are hypoactive in upper extremities and asymmetrically increased in the right lower extremity with an extensor plantar  response.  This is a somewhat complex case as there are several features that do not clearly define a specific etiology. First, he has elevated muscle enzymes and hyperintensity of the thigh muscles on imaging, highly suggestive of a myositis. However, muscle biopsy did not yield any abnormalities, albeit sampling errors is possible. Nevertheless, most of his history, exam findings, and diagnostic testing points towards muscle disease.   Further, there are subtle findings on his neurological exam that makes me question motor neuron dysfunction.  The upper motor neuron findings including pathological facial reflexes, asymmetrically patellar reflex on the right, and positive Babinski on the right. Although motor neuron disease needs to be kept in mind, it would be very unusual for there to be manifestations of muscle hyperintensity and CK levels so high.  Other possibilities include inclusion body myositis however the subacute and progressive course is rather quick for this disease, and muscle biopsy is generally abnormal; adult onset limb-girdle muscular dystrophy, such as dysferlinopathy is also a consideration, but again, the course is gradual. I do not feel that he has a neuromuscular junction disorder such as myasthenia gravis, as it does not present with hyperCKemia.  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 At this juncture, I electrodiagnostic testing of the right arm and leg to help characterize the nature of his symptoms.   PLAN/RECOMMENDATIONS:  1.  EMG of the right arm and leg 2.  Start robinul solution at bedtime for secretions. 3.  Discussed that if weight loss continues and patient has ongoing problems with swallowing, may need to consider PEG tube placement for nutrition.   4.  Pulmonary function studies 5.  MRI brain wwo contrast  6.  Continue current medications  7.  Return to clinic 2-3 weeks   The  duration of this appointment visit was 65 minutes of face-to-face time with the patient.  Greater than 50% of this time was spent in counseling, explanation of diagnosis, planning of further management, and coordination of care.   Thank you for allowing me to participate in patient's care.  If I can answer any additional questions, I would be pleased to do so.    Sincerely,     K. Posey Pronto, DO

## 2013-12-17 NOTE — Patient Instructions (Addendum)
1.  We will schedule for your EMG of the right arm and leg 2.  Start robinul solution at bedtime for secretions. 3.  If weight loss continues and you still have problems with swallowing, may need to consider PEG tube placement for nutrition 4.  Pulmonary function studies 5.  MRI brain wwo contrast 6.  Continue your medications as you are taking them 7.  Return to clinic 2-3 weeks

## 2013-12-17 NOTE — Progress Notes (Signed)
Note faxed.

## 2013-12-19 ENCOUNTER — Ambulatory Visit: Payer: 59 | Admitting: Neurology

## 2013-12-22 ENCOUNTER — Telehealth: Payer: Self-pay | Admitting: Neurology

## 2013-12-22 NOTE — Telephone Encounter (Signed)
[name] spouse called, Gwinda Passe stating that pt has been admitted to [facility] and had his EMG. Gwinda Passe believes that her spouse has had a all around evaluation for neurology that he no longer Needs to be seen by Dr. Posey Pronto. If you have any questions, please call [place].  C/B  [phone]

## 2013-12-22 NOTE — Telephone Encounter (Signed)
FYI

## 2013-12-22 NOTE — Telephone Encounter (Signed)
Noted.  Caryl Pina, can you call and ask whether they still want to keep their f/u appointment or will they be transitioning care to [facility]?

## 2013-12-22 NOTE — Telephone Encounter (Signed)
Called patient's wife and left message for her to call me back.

## 2013-12-24 NOTE — Telephone Encounter (Signed)
Left message for patient requesting for him to cancel MRI and PFT if he is going to have them done at [facility] but he can keep them if he wants to do them here.

## 2013-12-25 ENCOUNTER — Encounter: Payer: 59 | Admitting: Neurology

## 2014-01-05 ENCOUNTER — Other Ambulatory Visit: Payer: 59

## 2014-01-06 ENCOUNTER — Ambulatory Visit: Payer: 59 | Admitting: Neurology

## 2014-01-07 ENCOUNTER — Ambulatory Visit: Payer: 59 | Attending: Internal Medicine

## 2014-01-07 DIAGNOSIS — R1312 Dysphagia, oropharyngeal phase: Secondary | ICD-10-CM | POA: Insufficient documentation

## 2014-01-07 DIAGNOSIS — R1310 Dysphagia, unspecified: Secondary | ICD-10-CM | POA: Diagnosis present

## 2014-01-15 ENCOUNTER — Ambulatory Visit: Payer: 59

## 2014-01-30 ENCOUNTER — Ambulatory Visit: Payer: 59 | Attending: Internal Medicine

## 2014-01-30 DIAGNOSIS — R1310 Dysphagia, unspecified: Secondary | ICD-10-CM | POA: Diagnosis present

## 2014-01-30 DIAGNOSIS — R1312 Dysphagia, oropharyngeal phase: Secondary | ICD-10-CM

## 2014-01-30 NOTE — Therapy (Signed)
Speech Language Pathology Treatment  Patient Details  Name: Eric Martinez MRN: [id number] Date of Birth: 23-Jul-1960  Encounter Date: 01/30/2014      End of Session - 01/30/14 0857    Visit Number 3   Number of Visits 16   Date for SLP Re-Evaluation 03/08/14   SLP Start Time 0806   SLP Time Calculation (min) 0849   SLP Time Calculation (min) 43 min   Activity Tolerance Patient tolerated treatment well      Past Medical History  Diagnosis Date  . Hyperlipidemia   . Myositis     Past Surgical History  Procedure Laterality Date  . Appendectomy    . Minor muscle biopsy Right 11/18/2013    Procedure: MINOR MUSCLE BIOPSY;  Surgeon: Edward Jolly, MD;  Location: [facility];  Service: General;  Laterality: Right;    There were no vitals taken for this visit.  Visit Diagnosis: Dysphagia, oropharyngeal phase   S: Pt has not completed HEP as prescribed this week. Thinks swallowing is marginally better, speech is "a lot better".       ADULT SLP TREATMENT - 01/30/14 0823    General Information   Behavior/Cognition Alert;Cooperative;Pleasant mood   Treatment Provided   Treatment provided Dysphagia   Dysphagia Treatment   Oral Cavity - Dentition Adequate natural dentition   Treatment Methods Skilled observation;Therapeutic exercise   Patient observed directly with PO's Yes   Type of PO's observed Dysphagia 3 (soft);Thin liquids   Type of cueing Verbal;Visual  for breath hold and masako   Other treatment/comments --  liquid wash necessary - voice intermittently hypernasal   Pain Assessment   Pain Assessment No/denies pain   Cognitive-Linquistic Treatment   Treatment focused on Patient/family/caregiver education   Assessment / Recommendations / Plan   Plan Continue with current plan of care  as pt performed HEP with occasional min A   Dysphagia Recommendations   Diet recommendations Dysphagia 3 (mechanical soft);Dysphagia 2 (fine chop);Thin liquid    Compensations Clear throat intermittently;Slow rate;Small sips/bites  small meals frequently   Progression Toward Goals   Progression toward goals Progressing toward goals          SLP Education - 01/30/14 0855    Education provided Yes   Education Details Masako, breath hold   Person(s) Educated Patient   Methods Explanation;Demonstration   Comprehension Verbalized understanding;Returned demonstration;Verbal cues required          SLP Short Term Goals - 01/30/14 0859    SLP SHORT TERM GOAL #1   Title pt will perform dysphagia HEP wiht rare min A   Time 4   Period Weeks   Status New   SLP SHORT TERM GOAL #2   Title pt will follow swallow strategies with rare min A   Time 4   Period Weeks   Status New          SLP Long Term Goals - 01/30/14 0900    SLP LONG TERM GOAL #1   Title pt will demo swallow precautions to decr aspiration risk   Time 8   Period Weeks   Status New   SLP LONG TERM GOAL #2   Title pt will complete HEP with modified independence to improve swallow muscle strength to minimize risk of aspiration   Time 8   Period Weeks   Status New          Plan - 01/30/14 0857    Clinical Impression Statement Pt presents with cont  dysphagia, but improving swallowing function. Voice has improved greatly.   Speech Therapy Frequency 1x /week  original POC was for 2/week, but decreased today due to success with HEP   Duration --  8 weeks   Treatment/Interventions Pharyngeal strengthening exercises;Oral motor exercises;Patient/family education;SLP instruction and feedback   Potential to Achieve Goals Good    SLP strongly encouraged pt to complete HEP at least x3/day, achieving approx 40-50 reps of each exercises except Shaker, faux gargle, and supraglottic.     Problem List Patient Active Problem List   Diagnosis Date Noted  . Muscle weakness 12/17/2013                                                [facility], SLP 01/30/2014, 9:20 AM

## 2014-01-30 NOTE — Addendum Note (Signed)
Addended by: Garald Balding B on: 01/30/2014 09:51 AM   Modules accepted: Medications

## 2014-01-30 NOTE — Patient Instructions (Signed)
Make sure you get at LEAST 3x/day with the exercises - 4x is best, 5x is great.

## 2014-02-03 ENCOUNTER — Encounter: Payer: 59 | Admitting: Speech Pathology

## 2014-02-05 ENCOUNTER — Encounter: Payer: Self-pay | Admitting: Speech Pathology

## 2014-02-05 ENCOUNTER — Ambulatory Visit: Payer: 59 | Admitting: Speech Pathology

## 2014-02-05 DIAGNOSIS — R1312 Dysphagia, oropharyngeal phase: Secondary | ICD-10-CM

## 2014-02-05 NOTE — Patient Instructions (Signed)
Pt is to continue dysphagia exercises 3x a day upon d/c

## 2014-02-05 NOTE — Therapy (Signed)
Speech Language Pathology Treatment  Patient Details  Name: Eric Martinez MRN: [id number] Date of Birth: [birth year]  Encounter Date: 02/05/2014      End of Session - 02/05/14 1138    Visit Number 4   Number of Visits 16   Date for SLP Re-Evaluation 03/08/14   SLP Start Time 1103   SLP Time Calculation (min) 1138   SLP Time Calculation (min) 35 min      Past Medical History  Diagnosis Date  . Hyperlipidemia   . Myositis     Past Surgical History  Procedure Laterality Date  . Appendectomy    . Minor muscle biopsy Right 11/18/2013    Procedure: MINOR MUSCLE BIOPSY;  Surgeon: Edward Jolly, MD;  Location: [place];  Service: General;  Laterality: Right;    There were no vitals taken for this visit.  Visit Diagnosis: Dysphagia, oropharyngeal phase          ADULT SLP TREATMENT - 02/05/14 0001    General Information   Behavior/Cognition Alert;Pleasant mood   Treatment Provided   Treatment provided Dysphagia   Dysphagia Treatment   Temperature Spikes Noted No   Oral Cavity - Dentition Adequate natural dentition   Treatment Methods Skilled observation;Therapeutic exercise;Compensation strategy training;Patient/caregiver education   Patient observed directly with PO's Yes   Type of PO's observed Dysphagia 3 (soft);Thin liquids   Amount of cueing Modified independent   Cognitive-Linquistic Treatment   Treatment focused on Patient/family/caregiver education   Skilled Treatment Pt performed HEP with modified indepedence, required rare minimal cues for laryngeal breath hold. Pt verbalized swallow precautions and s/s and risks of aspiration. PO trials with soft solids and thin liquids without s/s of aspiration.   Assessment / Recommendations / Plan   Plan All goals met;Discharge SLP treatment due to (comment)   Dysphagia Recommendations   Diet recommendations Dysphagia 3 (mechanical soft)   Liquids provided via Cup;Straw   Medication  Administration Whole meds with liquid   Supervision Patient able to self feed   Compensations Slow rate;Small sips/bites;Follow solids with liquid;Multiple dry swallows after each bite/sip   General Recommendations   Oral Care Recommendations Oral care BID   Progression Toward Goals   Progression toward goals Goals met, education completed, patient discharged from Agency Education - 02/05/14 1137    Education provided Yes   Education Details breath hold, apsiration risks,   Person(s) Educated Patient   Methods Explanation;Demonstration;Verbal cues   Comprehension Verbalized understanding;Returned demonstration          SLP Short Term Goals - 02/05/14 1140    SLP SHORT TERM GOAL #1   Title pt will perform dysphagia HEP wiht rare min A   Time 4   Period Weeks   Status Achieved   SLP SHORT TERM GOAL #2   Title pt will follow swallow strategies with rare min A   Time 4   Period Weeks   Status Achieved          SLP Long Term Goals - 02/05/14 1141    SLP LONG TERM GOAL #1   Title pt will demo swallow precautions to decr aspiration risk   Time 8   Period Weeks   Status Achieved   SLP LONG TERM GOAL #2   Title pt will complete HEP with modified independence to improve swallow muscle strength to minimize risk of aspiration   Time 8   Period Weeks   Status  Achieved          Plan - 02/05/14 1141    Clinical Impression Statement Pt with modified indepence with swallow HEP, swallow precautions and voice has returned to WNL. D/c ST.        Problem List Patient Active Problem List   Diagnosis Date Noted  . Muscle weakness 12/17/2013               SPEECH THERAPY DISCHARGE SUMMARY  Visits from Start of Care: 4  Current functional level related to goals / functional outcomes:  See Treatment note above. All LTG achieved                                 Remaining deficits: Mild pharyngeal dysphagia with hard solids, Pt to  continue dysphagia HEP daily.   Education / Equipment:   Plan: Patient agrees to discharge.  Patient goals were met. Patient is being discharged due to meeting the stated rehab goals.  ?????                                     Laya Letendre, Annye Rusk 02/05/2014, 11:42 AM  Harding Thomure, Annye Rusk, CCC-SLP

## 2014-02-09 ENCOUNTER — Ambulatory Visit: Payer: 59

## 2014-02-17 ENCOUNTER — Encounter: Payer: 59 | Attending: Internal Medicine | Admitting: *Deleted

## 2014-02-17 ENCOUNTER — Encounter: Payer: Self-pay | Admitting: *Deleted

## 2014-02-17 ENCOUNTER — Encounter: Payer: 59 | Admitting: Speech Pathology

## 2014-02-17 VITALS — Ht 71.0 in | Wt 186.0 lb

## 2014-02-17 DIAGNOSIS — R634 Abnormal weight loss: Secondary | ICD-10-CM | POA: Diagnosis not present

## 2014-02-17 DIAGNOSIS — Z713 Dietary counseling and surveillance: Secondary | ICD-10-CM | POA: Insufficient documentation

## 2014-02-17 NOTE — Patient Instructions (Signed)
Consider limiting Gatorades, replace with water with fruit or carbonated water There appear to be no adverse side effects from alcohol and your medications.   Also you can't get diabetes from eating sugar- it's genetic You're doing just fine.  Be patient with the exercise and do whatever PT says.  Ask about yoga Your current weight and body fat are totally fine. You can do whatever you want at this point consider chocolate milk for recovery beverage Limit caffeine to 1 drink/day

## 2014-02-17 NOTE — Progress Notes (Signed)
  Medical Nutrition Therapy:  Appt start time: 1400 end time:  6948.   Assessment:  Primary concerns today: Eric Martinez is here for nutrition counseling.  He was diagnosed with a muscle condition that  Is causing swallowing issues and he lost 50 pounds in very short period of time due to those swallowing difficulties.  His swallowing is improving and his weight is coming back on, but he is still wanting guidance on how to eat to rebuild his body.  He is still needing some modified textures.  He is working with a Astronomer and is on prednisone and he's getting better.  He has also worked with physical therapy.   Dry, starchy foods are still difficult or things that have shells or rough bits.  Liquids have not been an issue and he doesn't require thickened liquids.   Before this he weighed 205 and was active, but also liked beer and food so he thinks that might have been too heavy.  He thinks his ideal weight is 185-190 He is gaining weight back, but he's concerned he's gaining too much fat vs muscle  [facility]  BODY COMP RESULTS  02/17/14   BMI (kg/m^2) 25.9   Fat Mass (lbs) 35.5   Fat Free Mass (lbs) 150.5   Total Body Water (lbs) 110    Preferred Learning Style:   No preference indicated   Learning Readiness:   Ready   MEDICATIONS: see list   DIETARY INTAKE:  Usual eating pattern includes 3 meals and 2 snacks per day.  Everyday foods include proteins, starches, sweets, vegetables.  Avoided foods include none.    24-hr recall:  B ( AM): greek yogurt, couple of eggs or oatmeal. Oj and coffee (black)  Snk ( AM): not usually  L ( PM): ham or [place] sandwich with soup Snk ( PM): chocolate or muffin or cookies more often than not D ( PM): meat and vegetables and starch Snk ( PM): ice cream or milkshake Beverages: Gatorade, OJ, black coffee  Usual physical activity: goes to gym 3 days/week: 30 min cardio and very light weights; stretches 7 days  Estimated energy needs: 2200  calories 275 g carbohydrates 110 g protein 73 g fat    Nutritional Diagnosis:  NB-1.1 Food and nutrition-related knowledge deficit As related to proper balance of macronutrient.  As evidenced by patient self-reported knowledge deficit.    Intervention:  Nutrition counseling provided.  Per [facility] body scan, Eric Martinez's current weight and body composition is WNL.  His fat % is normal and he is at his ideal body weight for himself.  His diet is fine.  He is frustrated with the limited amount of physical activity he's allowed to do per physical therapy, but he knows he needs to be patient..  Answered some of his questions about sugar, alcohol, and salt intake, but basically he's doing just fine.  Goals: Consider limiting Gatorades, replace with water with fruit or carbonated water There appear to be no adverse side effects from alcohol and your medications per reference manual.   Be patient with the exercise and do whatever PT says.  Ask about yoga consider chocolate milk for recovery beverage instead of protein shake Limit caffeine to 1 drink/day  Teaching Method Utilized:  Auditory   Barriers to learning/adherence to lifestyle change: none  Demonstrated degree of understanding via:  Teach Back   Monitoring/Evaluation:  Dietary intake, exercise, and body weight prn.

## 2014-06-26 ENCOUNTER — Other Ambulatory Visit: Payer: Self-pay | Admitting: Gastroenterology

## 2015-03-21 DIAGNOSIS — C801 Malignant (primary) neoplasm, unspecified: Secondary | ICD-10-CM

## 2015-03-21 HISTORY — DX: Malignant (primary) neoplasm, unspecified: C80.1

## 2015-05-12 MED FILL — METHOTREXATE 25 MG/ML VIAL: 50 | 28 days supply | Qty: 6 | Fill #2

## 2015-06-20 ENCOUNTER — Ambulatory Visit (INDEPENDENT_AMBULATORY_CARE_PROVIDER_SITE_OTHER): Payer: 59 | Admitting: Osteopathic Medicine

## 2015-06-20 VITALS — BP 104/70 | HR 77 | Temp 98.3°F | Resp 16 | Ht 70.0 in | Wt 211.8 lb

## 2015-06-20 DIAGNOSIS — S91331A Puncture wound without foreign body, right foot, initial encounter: Secondary | ICD-10-CM

## 2015-06-20 DIAGNOSIS — Z23 Encounter for immunization: Secondary | ICD-10-CM | POA: Diagnosis not present

## 2015-06-20 DIAGNOSIS — S91339A Puncture wound without foreign body, unspecified foot, initial encounter: Secondary | ICD-10-CM | POA: Insufficient documentation

## 2015-06-20 NOTE — Patient Instructions (Signed)
Puncture Wound A puncture wound is an injury that is caused by a sharp, thin object that goes through (penetrates) your skin. Usually, a puncture wound does not leave a large opening in your skin, so it may not bleed a lot. However, when you get a puncture wound, dirt or other materials (foreign bodies) can be forced into your wound and break off inside. This increases the chance of infection, such as tetanus. CAUSES Puncture wounds are caused by any sharp, thin object that goes through your skin, such as:  Animal teeth, as with an animal bite.  Sharp, pointed objects, such as nails, splinters of glass, fishhooks, and needles. SYMPTOMS Symptoms of a puncture wound include:  Pain.  Bleeding.  Swelling.  Bruising.  Fluid leaking from the wound.  Numbness, tingling, or loss of function. DIAGNOSIS This condition is diagnosed with a medical history and physical exam. Your wound will be checked to see if it contains any foreign bodies. You may also have X-rays or other imaging tests. TREATMENT Treatment for a puncture wound depends on how serious the wound is. It also depends on whether the wound contains any foreign bodies. Treatment for all types of puncture wounds usually starts with:  Controlling the bleeding.  Washing out the wound with a germ-free (sterile) salt-water solution.  Checking the wound for foreign bodies. Treatment may also include:  Having the wound opened surgically to remove a foreign object.  Closing the wound with stitches (sutures) if it continues to bleed.  Covering the wound with antibiotic ointments and a bandage (dressing).  Receiving a tetanus shot.  Receiving a rabies vaccine. HOME CARE INSTRUCTIONS Medicines  Take or apply over-the-counter and prescription medicines only as told by your health care provider.  If you were prescribed an antibiotic, take or apply it as told by your health care provider. Do not stop using the antibiotic even if  your condition improves. Wound Care  There are many ways to close and cover a wound. For example, a wound can be covered with sutures, skin glue, or adhesive strips. Follow instructions from your health care provider about:  How to take care of your wound.  When and how you should change your dressing.  When you should remove your dressing.  Removing whatever was used to close your wound.  Keep the dressing dry as told by your health care provider. Do not take baths, swim, use a hot tub, or do anything that would put your wound underwater until your health care provider approves.  Clean the wound as told by your health care provider.  Do not scratch or pick at the wound.  Check your wound every day for signs of infection. Watch for:  Redness, swelling, or pain.  Fluid, blood, or pus. General Instructions  Raise (elevate) the injured area above the level of your heart while you are sitting or lying down.  If your puncture wound is in your foot, ask your health care provider if you need to avoid putting weight on your foot and for how long.  Keep all follow-up visits as told by your health care provider. This is important. SEEK MEDICAL CARE IF:  You received a tetanus shot and you have swelling, severe pain, redness, or bleeding at the injection site.  You have a fever.  Your sutures come out.  You notice a bad smell coming from your wound or your dressing.  You notice something coming out of your wound, such as wood or glass.  Your   pain is not controlled with medicine.  You have increased redness, swelling, or pain at the site of your wound.  You have fluid, blood, or pus coming from your wound.  You notice a change in the color of your skin near your wound.  You need to change the dressing frequently due to fluid, blood, or pus draining from your wound.  You develop a new rash.  You develop numbness around your wound. SEEK IMMEDIATE MEDICAL CARE IF:  You  develop severe swelling around your wound.  Your pain suddenly increases and is severe.  You develop painful skin lumps.  You have a red streak going away from your wound.  The wound is on your hand or foot and you cannot properly move a finger or toe.  The wound is on your hand or foot and you notice that your fingers or toes look pale or bluish.   This information is not intended to replace advice given to you by your health care provider. Make sure you discuss any questions you have with your health care provider.   Document Released: 12/14/2004 Document Revised: 11/25/2014 Document Reviewed: 04/29/2014 Elsevier Interactive Patient Education 2016 [employer]  

## 2015-06-20 NOTE — Progress Notes (Signed)
HPI: Eric Martinez is a 55 y.o. male who presents to [facility] today for chief complaint of:  Chief Complaint  Patient presents with  . Immunizations    per patient stepped on nail today     . Location: Sole R foot . Quality: puncture wound . Severity: mild . Duration: happened few hours ago today . Context: was hiking and stepped on nail  Modifying factors: Tetanus vaccine (Tdap) documented given in [facility] in 07/26/2008 (>5 years ago).     Past medical, social and family history reviewed: Past Medical History  Diagnosis Date  . Hyperlipidemia   . Myositis    Past Surgical History  Procedure Laterality Date  . Appendectomy    . Minor muscle biopsy Right 11/18/2013    Procedure: MINOR MUSCLE BIOPSY;  Surgeon: Edward Jolly, MD;  Location: [place];  Service: General;  Laterality: Right;   Social History  Substance Use Topics  . Smoking status: Former Smoker -- 5 years  . Smokeless tobacco: Never Used     Comment: 2010  . Alcohol Use: Yes     Comment: 3-5 beers/nightly, more on the weeekends for at least 20 years   Family History  Problem Relation Age of Onset  . Pancreatic cancer Father     Deceased  . Alzheimer's disease Mother     Deceased  . Hypertension Mother   . Healthy Sister   . Hypertension Sister   . Healthy Brother     Current Outpatient Prescriptions  Medication Sig Dispense Refill  . Cholecalciferol (VITAMIN D3) LIQD Inject 2 mLs as directed.    . folic acid (FOLVITE) 1 MG tablet Take 1 mg by mouth daily.    . methotrexate (50 MG/ML) 1 G injection Inject into the vein once a week.    . Methotrexate, PF, 10 MG/0.2ML SOAJ Inject into the skin.    . Multiple Vitamin (MULTIVITAMIN) tablet Take 1 tablet by mouth daily. Reported on 06/20/2015    . predniSONE (DELTASONE) 20 MG tablet Take 80 mg by mouth daily with breakfast.     . ALPRAZolam (XANAX) 0.5 MG tablet Take 0.5 mg by mouth at bedtime as needed for  anxiety. Reported on 06/20/2015    . azaTHIOprine (IMURAN) 50 MG tablet Take 50 mg by mouth daily. Reported on 06/20/2015    . Glycopyrrolate (ROBINUL) 0.4 MG/2ML SOLN Take 1 mL (0.2 mg total) by mouth at bedtime as needed (secretions). (Patient not taking: Reported on 02/17/2014) 50 mL 3  . Leucovorin Calcium (CALCIUM FOLINATE) 100 MG/10ML SOLN Inject 10 mLs as directed. Reported on 06/20/2015    . omeprazole (PRILOSEC) 2 mg/mL SUSP Inject 200 mg as directed daily. Reported on 06/20/2015    . Sulfamethoxazole-Trimethoprim 800-160 MG/20ML SUSP Take 20 mLs by mouth 3 (three) times a week. Reported on 06/20/2015     No current facility-administered medications for this visit.   No Known Allergies    Review of Systems: CONSTITUTIONAL:  No  fever, no chills, MUSCULOSKELETAL: No  myalgia/arthralgia SKIN: No  rash/wounds/concerning lesions, puncture wound as noted HPI  Exam:  BP 104/70 mmHg  Pulse 77  Temp(Src) 98.3 F (36.8 C) (Oral)  Resp 16  Ht 5\' 10"  (1.778 m)  Wt 211 lb 12.8 oz (96.072 kg)  BMI 30.39 kg/m2  SpO2 97% Constitutional: VS see above. General Appearance: alert, well-developed, well-nourished, NAD Musculoskeletal: Gait normal. No clubbing/cyanosis of digits.  Skin: Small shalow puncture wound on plantar aspect  R foot, not into dermis/subq, skin is otherwise warm, dry, intact. No rash/ulcer. No concerning nevi or subq nodules on limited exam.     ASSESSMENT/PLAN:  Puncture wound of foot, right, initial encounter - Plan: Tdap vaccine greater than or equal to 7yo IM  Need for tetanus booster - Last Tdap >5 years ago in presence of puncture wound  - Plan: Tdap vaccine greater than or equal to 7yo IM    Wound management and tetanus prophylaxis  Previous doses of tetanus toxoid* Clean and minor wound All other wounds   Tetanus toxoid-containing vaccine? Human tetanus immune globulin Tetanus toxoid-containing vaccine? Human tetanus immune globulin?  <3 doses or unknown Yes No Yes  Yes  ?3 doses Only if last dose given ?10 years ago No Only if last dose given ?5 years ago No  Source: UpToDate 2017   Visit summary printed and instructions reviewed with the patient. All questions answered. Return if symptoms worsen or fail to improve.

## 2015-06-23 MED FILL — METHOTREXATE 25 MG/ML VIAL: 50 | 28 days supply | Qty: 6 | Fill #3

## 2015-07-27 ENCOUNTER — Other Ambulatory Visit: Payer: Self-pay | Admitting: Internal Medicine

## 2015-07-27 DIAGNOSIS — R221 Localized swelling, mass and lump, neck: Secondary | ICD-10-CM

## 2015-07-28 MED FILL — METHOTREXATE 25 MG/ML VIAL: 50 | 28 days supply | Qty: 6 | Fill #4

## 2015-08-02 ENCOUNTER — Ambulatory Visit
Admission: RE | Admit: 2015-08-02 | Discharge: 2015-08-02 | Disposition: A | Discharge status: Home / Self Care | Payer: 59 | Source: Ambulatory Visit | Attending: Internal Medicine | Admitting: Internal Medicine

## 2015-08-02 DIAGNOSIS — R221 Localized swelling, mass and lump, neck: Secondary | ICD-10-CM

## 2015-08-03 ENCOUNTER — Other Ambulatory Visit: Payer: Self-pay | Admitting: Internal Medicine

## 2015-08-04 ENCOUNTER — Other Ambulatory Visit: Payer: Self-pay | Admitting: Internal Medicine

## 2015-08-04 DIAGNOSIS — R221 Localized swelling, mass and lump, neck: Secondary | ICD-10-CM

## 2015-08-09 MED FILL — predniSONE 1 MG TABS: 1 | 10 days supply | Qty: 90 | Fill #3

## 2015-08-10 ENCOUNTER — Ambulatory Visit
Admission: RE | Admit: 2015-08-10 | Discharge: 2015-08-10 | Disposition: A | Discharge status: Home / Self Care | Payer: 59 | Source: Ambulatory Visit | Attending: Internal Medicine | Admitting: Internal Medicine

## 2015-08-10 DIAGNOSIS — R221 Localized swelling, mass and lump, neck: Secondary | ICD-10-CM

## 2015-08-10 MED ORDER — IOPAMIDOL (ISOVUE-370) INJECTION 76%
75.0000 mL | Freq: Once | INTRAVENOUS | Status: AC | PRN
  Administered 2015-08-10: 75 mL via INTRAVENOUS

## 2015-08-12 ENCOUNTER — Other Ambulatory Visit (HOSPITAL_COMMUNITY): Payer: Self-pay | Admitting: Otolaryngology

## 2015-08-12 DIAGNOSIS — R590 Localized enlarged lymph nodes: Secondary | ICD-10-CM

## 2015-08-23 ENCOUNTER — Other Ambulatory Visit: Payer: Self-pay | Admitting: Radiology

## 2015-08-23 MED FILL — AZITHROMYCIN 250 MG TABLET: 250 | 5 days supply | Qty: 6 | Fill #0

## 2015-08-24 ENCOUNTER — Ambulatory Visit (HOSPITAL_COMMUNITY)
Admission: RE | Admit: 2015-08-24 | Discharge: 2015-08-24 | Disposition: A | Discharge status: Home / Self Care | Payer: 59 | Source: Ambulatory Visit | Attending: Otolaryngology | Admitting: Otolaryngology

## 2015-08-24 ENCOUNTER — Encounter (HOSPITAL_COMMUNITY): Payer: Self-pay

## 2015-08-24 DIAGNOSIS — Z87891 Personal history of nicotine dependence: Secondary | ICD-10-CM | POA: Diagnosis not present

## 2015-08-24 DIAGNOSIS — M609 Myositis, unspecified: Secondary | ICD-10-CM | POA: Diagnosis not present

## 2015-08-24 DIAGNOSIS — C969 Malignant neoplasm of lymphoid, hematopoietic and related tissue, unspecified: Secondary | ICD-10-CM | POA: Diagnosis not present

## 2015-08-24 DIAGNOSIS — R590 Localized enlarged lymph nodes: Secondary | ICD-10-CM | POA: Insufficient documentation

## 2015-08-24 DIAGNOSIS — E785 Hyperlipidemia, unspecified: Secondary | ICD-10-CM | POA: Insufficient documentation

## 2015-08-24 LAB — BASIC METABOLIC PANEL
Anion gap: 8 (ref 5–15)
BUN: 12 mg/dL (ref 6–20)
CO2: 24 mmol/L (ref 22–32)
Calcium: 9 mg/dL (ref 8.9–10.3)
Chloride: 104 mmol/L (ref 101–111)
Creatinine, Ser: 1.09 mg/dL (ref 0.61–1.24)
GFR calc Af Amer: >60 mL/min (ref >60)
GFR calc non Af Amer: >60 mL/min (ref >60)
Glucose, Bld: 108 mg/dL — ABNORMAL HIGH (ref 65–99)
Potassium: 4.1 mmol/L (ref 3.5–5.1)
Sodium: 136 mmol/L (ref 135–145)

## 2015-08-24 LAB — CBC WITH DIFFERENTIAL/PLATELET
Basophils Absolute: 0.1 10*3/uL (ref 0.0–0.1)
Basophils Relative: 1 %
Eosinophils Absolute: 0.1 10*3/uL (ref 0.0–0.7)
Eosinophils Relative: 1 %
HCT: 39.5 % (ref 39.0–52.0)
Hemoglobin: 13.7 g/dL (ref 13.0–17.0)
Lymphocytes Relative: 12 %
Lymphs Abs: 1 10*3/uL (ref 0.7–4.0)
MCH: 34.3 pg — ABNORMAL HIGH (ref 26.0–34.0)
MCHC: 34.7 g/dL (ref 30.0–36.0)
MCV: 99 fL (ref 78.0–100.0)
Monocytes Absolute: 1 10*3/uL (ref 0.1–1.0)
Monocytes Relative: 12 %
Neutro Abs: 6 10*3/uL (ref 1.7–7.7)
Neutrophils Relative %: 74 %
Platelets: 173 10*3/uL (ref 150–400)
RBC: 3.99 MIL/uL — ABNORMAL LOW (ref 4.22–5.81)
RDW: 13.1 % (ref 11.5–15.5)
WBC: 8.1 10*3/uL (ref 4.0–10.5)

## 2015-08-24 LAB — PROTIME-INR
INR: 0.98 (ref 0.00–1.49)
Prothrombin Time: 13.2 seconds (ref 11.6–15.2)

## 2015-08-24 MED ORDER — FENTANYL CITRATE (PF) 100 MCG/2ML IJ SOLN
INTRAMUSCULAR | Status: AC | PRN
  Administered 2015-08-24: 50 ug via INTRAVENOUS

## 2015-08-24 MED ORDER — MIDAZOLAM HCL 2 MG/2ML IJ SOLN
INTRAMUSCULAR | Status: AC
  Filled 2015-08-24: qty 6

## 2015-08-24 MED ORDER — SODIUM CHLORIDE 0.9 % IV SOLN: INTRAVENOUS | Status: DC

## 2015-08-24 MED ORDER — MIDAZOLAM HCL 2 MG/2ML IJ SOLN
INTRAMUSCULAR | Status: AC | PRN
  Administered 2015-08-24: 2 mg via INTRAVENOUS
  Administered 2015-08-24 (×2): 1 mg via INTRAVENOUS

## 2015-08-24 MED ORDER — FENTANYL CITRATE (PF) 100 MCG/2ML IJ SOLN
INTRAMUSCULAR | Status: AC
  Filled 2015-08-24: qty 4

## 2015-08-24 MED FILL — HYDROCODONE-CHLORPHENIRAM S: 10-8 | 12 days supply | Qty: 120 | Fill #0

## 2015-08-24 NOTE — Discharge Instructions (Signed)
Needle Biopsy, Care After °These instructions give you information about caring for yourself after your procedure. Your doctor may also give you more specific instructions. Call your doctor if you have any problems or questions after your procedure. °HOME CARE °· Rest as told by your doctor. °· Take medicines only as told by your doctor. °· There are many different ways to close and cover the biopsy site, including stitches (sutures), skin glue, and adhesive strips. Follow instructions from your doctor about: °¨ How to take care of your biopsy site. °¨ When and how you should change your bandage (dressing). °¨ When you should remove your dressing. °¨ Removing whatever was used to close your biopsy site. °· Check your biopsy site every day for signs of infection. Watch for: °¨ Redness, swelling, or pain. °¨ Fluid, blood, or pus. °GET HELP IF: °· You have a fever. °· You have redness, swelling, or pain at the biopsy site, and it lasts longer than a few days. °· You have fluid, blood, or pus coming from the biopsy site. °· You feel sick to your stomach (nauseous). °· You throw up (vomit). °GET HELP RIGHT AWAY IF: °· You are short of breath. °· You have trouble breathing. °· Your chest hurts. °· You feel dizzy or you pass out (faint). °· You have bleeding that does not stop with pressure or a bandage. °· You cough up blood. °· Your belly (abdomen) hurts. °  °This information is not intended to replace advice given to you by your health care provider. Make sure you discuss any questions you have with your health care provider. °  °Document Released: 02/17/2008 Document Revised: 07/21/2014 Document Reviewed: 03/02/2014 °Elsevier Interactive Patient Education ©2016 [employer] °Moderate Conscious Sedation, Adult, Care After °Refer to this sheet in the next few weeks. These instructions provide you with information on caring for yourself after your procedure. Your health care provider may also give you more specific  instructions. Your treatment has been planned according to current medical practices, but problems sometimes occur. Call your health care provider if you have any problems or questions after your procedure. °WHAT TO EXPECT AFTER THE PROCEDURE  °After your procedure: °· You may feel sleepy, clumsy, and have poor balance for several hours. °· Vomiting may occur if you eat too soon after the procedure. °HOME CARE INSTRUCTIONS °· Do not participate in any activities where you could become injured for at least 24 hours. Do not: °· Drive. °· Swim. °· Ride a bicycle. °· Operate heavy machinery. °· Cook. °· Use power tools. °· Climb ladders. °· Work from a high place. °· Do not make important decisions or sign legal documents until you are improved. °· If you vomit, drink water, juice, or soup when you can drink without vomiting. Make sure you have little or no nausea before eating solid foods. °· Only take over-the-counter or prescription medicines for pain, discomfort, or fever as directed by your health care provider. °· Make sure you and your family fully understand everything about the medicines given to you, including what side effects may occur. °· You should not drink alcohol, take sleeping pills, or take medicines that cause drowsiness for at least 24 hours. °· If you smoke, do not smoke without supervision. °· If you are feeling better, you may resume normal activities 24 hours after you were sedated. °· Keep all appointments with your health care provider. °SEEK MEDICAL CARE IF: °· Your skin is pale or bluish in color. °· You   continue to feel nauseous or vomit. °· Your pain is getting worse and is not helped by medicine. °· You have bleeding or swelling. °· You are still sleepy or feeling clumsy after 24 hours. °SEEK IMMEDIATE MEDICAL CARE IF: °· You develop a rash. °· You have difficulty breathing. °· You develop any type of allergic problem. °· You have a fever. °MAKE SURE YOU: °· Understand these  instructions. °· Will watch your condition. °· Will get help right away if you are not doing well or get worse. °  °This information is not intended to replace advice given to you by your health care provider. Make sure you discuss any questions you have with your health care provider. °  °Document Released: 12/25/2012 Document Revised: 03/27/2014 Document Reviewed: 12/25/2012 °Elsevier Interactive Patient Education ©2016 [employer] ° °

## 2015-08-24 NOTE — Procedures (Signed)
Interventional Radiology Procedure Note  Procedure:  US guided biopsy right neck mass.   Complications: None  Estimated Blood Loss: None  Recommendations: - Bedrest x 1 hr - Path pending  Signed,  Criselda Peaches, MD

## 2015-08-24 NOTE — Consult Note (Signed)
Chief Complaint: Patient was seen in consultation today for ultrasound-guided right cervical lymph node biopsy  Referring Physician(s): Rosen,Jefry  Supervising Physician: Jacqulynn Cadet  Patient Status: Out-pt  History of Present Illness: Eric Martinez is a 55 y.o. male with history of dermatomyositis, currently on prednisone and methotrexate therapy as well as recent sinus drainage/sinusitis due to began Z-Pak. Over the past 1-1/2 months he has noted a right neck mass which has enlarged in size and is tender at this point. CT of the neck on 08/10/15 revealed a necrotic right jugulodigastric lymph node. Patient has no history of cancer and is a former smoker. Request now received for ultrasound-guided biopsy of the right cervical lymph node.  Past Medical History  Diagnosis Date  . Hyperlipidemia   . Myositis     Past Surgical History  Procedure Laterality Date  . Appendectomy    . Minor muscle biopsy Right 11/18/2013    Procedure: MINOR MUSCLE BIOPSY;  Surgeon: Edward Jolly, MD;  Location: [facility];  Service: General;  Laterality: Right;    Allergies: Review of patient's allergies indicates no known allergies.  Medications: Prior to Admission medications   Medication Sig Start Date End Date Taking? Authorizing Provider  Cholecalciferol (VITAMIN D3) LIQD Inject 2 mLs as directed.   Yes Historical Provider, MD  folic acid (FOLVITE) 1 MG tablet Take 1 mg by mouth daily.   Yes Historical Provider, MD  Multiple Vitamin (MULTIVITAMIN) tablet Take 1 tablet by mouth daily. Reported on 06/20/2015   Yes Historical Provider, MD  predniSONE (DELTASONE) 20 MG tablet Take 80 mg by mouth daily with breakfast.    Yes Historical Provider, MD  ALPRAZolam (XANAX) 0.5 MG tablet Take 0.5 mg by mouth at bedtime as needed for anxiety. Reported on 06/20/2015    Historical Provider, MD  azaTHIOprine (IMURAN) 50 MG tablet Take 50 mg by mouth daily. Reported on 06/20/2015     Historical Provider, MD  Glycopyrrolate (ROBINUL) 0.4 MG/2ML SOLN Take 1 mL (0.2 mg total) by mouth at bedtime as needed (secretions). Patient not taking: Reported on 02/17/2014 12/17/13   Alda Berthold, DO  Leucovorin Calcium (CALCIUM FOLINATE) 100 MG/10ML SOLN Inject 10 mLs as directed. Reported on 06/20/2015    Historical Provider, MD  methotrexate (50 MG/ML) 1 G injection Inject into the vein once a week.    Historical Provider, MD  Methotrexate, PF, 10 MG/0.2ML SOAJ Inject into the skin.    Historical Provider, MD  omeprazole (PRILOSEC) 2 mg/mL SUSP Inject 200 mg as directed daily. Reported on 06/20/2015    Historical Provider, MD  Sulfamethoxazole-Trimethoprim 800-160 MG/20ML SUSP Take 20 mLs by mouth 3 (three) times a week. Reported on 06/20/2015    Historical Provider, MD     Family History  Problem Relation Age of Onset  . Pancreatic cancer Father     Deceased  . Alzheimer's disease Mother     Deceased  . Hypertension Mother   . Healthy Sister   . Hypertension Sister   . Healthy Brother     Social History   Social History  . Marital Status: Married    Spouse Name: N/A  . Number of Children: N/A  . Years of Education: N/A   Social History Main Topics  . Smoking status: Former Smoker -- 5 years  . Smokeless tobacco: Never Used     Comment: 2010  . Alcohol Use: Yes     Comment: 3-5 beers/nightly, more on the weeekends for  at least 20 years  . Drug Use: No  . Sexual Activity: Yes    Birth Control/ Protection: Abstinence   Other Topics Concern  . None   Social History Narrative   Lives with wife in a one story home.   4 year college degree.   Works in [employer].               Review of Systems  Constitutional: Negative for fever, chills and unexpected weight change.  HENT: Positive for congestion, ear pain and sinus pressure. Negative for sore throat and trouble swallowing.   Respiratory: Negative for cough and shortness of breath.   Cardiovascular: Negative for  chest pain.  Gastrointestinal: Negative for nausea, vomiting, abdominal pain and blood in stool.  Genitourinary: Negative for dysuria and hematuria.  Musculoskeletal: Negative for back pain.  Neurological:       Sinus HA  Psychiatric/Behavioral: The patient is nervous/anxious.     Vital Signs: BP 130/86 mmHg  Pulse 73  Temp(Src) 98.6 F (37 C) (Oral)  Resp 18  SpO2 98%  Physical Exam  Constitutional: He is oriented to person, place, and time. He appears well-developed and well-nourished.  Cardiovascular: Normal rate and regular rhythm.   Pulmonary/Chest: Effort normal and breath sounds normal.  Abdominal: Soft. Bowel sounds are normal. There is no tenderness.  Musculoskeletal: Normal range of motion. He exhibits no edema.  Lymphadenopathy:    He has cervical adenopathy.  Neurological: He is alert and oriented to person, place, and time.    Mallampati Score:     Imaging: Ct Soft Tissue Neck W Contrast  08/10/2015  CLINICAL DATA:  Right-sided neck mass for 1 month. EXAM: CT NECK WITH CONTRAST TECHNIQUE: Multidetector CT imaging of the neck was performed using the standard protocol following the bolus administration of intravenous contrast. CONTRAST:  35 cc Isovue 370 intravenous. COMPARISON:  None. FINDINGS: Pharynx and larynx: No primary mass is seen. No asymmetry or suspicious enhancement. Salivary glands: Negative Thyroid: Negative Lymph nodes: The right neck mass correlates with a 33 mm heterogeneously enhancing and lobulated jugulodigastric node without fat inflammation, appearance primarily suggesting metastatic squamous cell carcinoma. It is possible there is a smaller contiguous subjacent node with low-density center. No contralateral adenopathy. If histology is positive and occult or ipsilateral primary this is at least N2a disease. Vascular: Atherosclerosis without flow limiting stenosis. Limited intracranial: Negative Visualized orbits: Negative Mastoids and visualized  paranasal sinuses: Clear Skeleton: No aggressive or destructive process. Lower cervical disc degeneration. Upper chest: No apical nodules. These results will be called to the ordering clinician or representative by the Radiologist Assistant, and communication documented in the PACS or zVision Dashboard. IMPRESSION: Necrotic right jugulodigastric node, usually metastatic squamous cell carcinoma. No primary tumor is seen. Electronically Signed   By: Monte Fantasia M.D.   On: 08/10/2015 11:20   US Thyroid  08/02/2015  CLINICAL DATA:  55 year old male with a history of neck mass EXAM: ULTRASOUND OF HEAD/NECK SOFT TISSUES TECHNIQUE: Ultrasound examination of the head and neck soft tissues was performed in the area of clinical concern. COMPARISON:  None. FINDINGS: Grayscale and color duplex ultrasound performed in the region of clinical concern adjacent to the right year. There is a rounded, homogeneously hypoechoic soft tissue nodule measuring 2.7 cm x 2.0 cm x 3.1 cm. No internal flow, although there is adjacent flow. There is a hyperechoic crescent within the center, which may represent the hyperechoic hilum of a possible lymph node. IMPRESSION: Soft tissue nodule in the  region of clinical concern, which may be an enlarged lymph node, or a nonspecific hypoechoic soft tissue nodule/mass. Further evaluation with contrast-enhanced CT is recommended, particularly if the patient has a history of malignancy. Signed, Dulcy Fanny. Earleen Newport, DO [facility] Electronically Signed   By: Corrie Mckusick D.O.   On: 08/02/2015 18:00    Labs:  CBC:  Recent Labs  08/24/15 1107  WBC 8.1  HGB 13.7  HCT 39.5  PLT 173    COAGS:  Recent Labs  08/24/15 1107  INR 0.98    BMP:  Recent Labs  08/24/15 1107  NA 136  K 4.1  CL 104  CO2 24  GLUCOSE 108*  BUN 12  CALCIUM 9.0  CREATININE 1.09  GFRNONAA >60  GFRAA >60    LIVER FUNCTION TESTS: No results  for input(s): BILITOT, AST, ALT, ALKPHOS, PROT, ALBUMIN in the last 8760 hours.  TUMOR MARKERS: No results for input(s): AFPTM, CEA, CA199, CHROMGRNA in the last 8760 hours.  Assessment and Plan: 55 y.o. male with history of dermatomyositis, currently on prednisone and methotrexate therapy as well as recent sinus drainage/sinusitis due to began Z-Pak. Over the past 1-1/2 months he has noted a right neck mass which has enlarged in size and is tender at this point. CT of the neck on 08/10/15 revealed a necrotic right jugulodigastric lymph node. Patient has no history of cancer and is a former smoker. Request now received for ultrasound-guided biopsy of the right cervical lymph node.Risks and benefits discussed with the patient/wife including, but not limited to bleeding, infection, damage to adjacent structures or low yield requiring additional tests.All of the patient's questions were answered, patient is agreeable to proceed.Consent signed and in chart.     Thank you for this interesting consult.  I greatly enjoyed meeting Eric Martinez and look forward to participating in their care.  A copy of this report was sent to the requesting provider on this date.  Electronically Signed: D. Rowe Robert 08/24/2015, 12:38 PM   I spent a total of 20 minutes  in face to face in clinical consultation, greater than 50% of which was counseling/coordinating care for ultrasound-guided right cervical lymph node biopsy

## 2015-08-27 ENCOUNTER — Other Ambulatory Visit (HOSPITAL_COMMUNITY): Payer: Self-pay | Admitting: Otolaryngology

## 2015-08-27 DIAGNOSIS — IMO0002 Reserved for concepts with insufficient information to code with codable children: Secondary | ICD-10-CM

## 2015-08-27 DIAGNOSIS — C801 Malignant (primary) neoplasm, unspecified: Secondary | ICD-10-CM

## 2015-08-27 DIAGNOSIS — C7989 Secondary malignant neoplasm of other specified sites: Secondary | ICD-10-CM

## 2015-08-27 MED FILL — CLINDAMYCIN HCL 300 MG CAPS: 300 | 10 days supply | Qty: 30 | Fill #0

## 2015-08-30 ENCOUNTER — Encounter (HOSPITAL_COMMUNITY)
Admission: RE | Admit: 2015-08-30 | Discharge: 2015-08-30 | Disposition: A | Discharge status: Home / Self Care | Payer: 59 | Source: Ambulatory Visit | Attending: Otolaryngology | Admitting: Otolaryngology

## 2015-08-30 DIAGNOSIS — C7989 Secondary malignant neoplasm of other specified sites: Secondary | ICD-10-CM | POA: Insufficient documentation

## 2015-08-30 DIAGNOSIS — C801 Malignant (primary) neoplasm, unspecified: Secondary | ICD-10-CM | POA: Insufficient documentation

## 2015-08-30 LAB — GLUCOSE, CAPILLARY: Glucose-Capillary: 100 mg/dL — ABNORMAL HIGH (ref 65–99)

## 2015-08-30 MED ORDER — FLUDEOXYGLUCOSE F - 18 (FDG) INJECTION
11.6000 | Freq: Once | INTRAVENOUS | Status: AC | PRN
  Administered 2015-08-30: 11.6 via INTRAVENOUS

## 2015-09-06 MED FILL — predniSONE 1 MG TABS: 1 | 30 days supply | Qty: 90 | Fill #0

## 2015-09-07 ENCOUNTER — Ambulatory Visit (HOSPITAL_COMMUNITY): Payer: 59

## 2015-09-08 MED FILL — METHOTREXATE 25 MG/ML VIAL: 50 | 31 days supply | Qty: 6 | Fill #5

## 2015-09-10 ENCOUNTER — Ambulatory Visit: Payer: 59

## 2015-09-10 ENCOUNTER — Ambulatory Visit: Payer: 59 | Admitting: Radiation Oncology

## 2015-09-23 MED FILL — ONDANSETRON HCL 8 MG TABLET: 8 | 10 days supply | Qty: 30 | Fill #0

## 2015-09-23 MED FILL — PROCHLORPERAZINE 10 MG TAB: 10 | 8 days supply | Qty: 30 | Fill #0

## 2015-09-23 MED FILL — DEXAMETHASONE 4 MG TABLET: 4 | 6 days supply | Qty: 12 | Fill #0

## 2015-10-21 MED FILL — oxyCODONE HCL 5 MG TABS: 5 | 15 days supply | Qty: 90 | Fill #0

## 2015-10-28 MED FILL — predniSONE 1 MG TABS: 1 | 30 days supply | Qty: 30 | Fill #0

## 2015-11-25 MED FILL — predniSONE 1 MG TABS: 1 | 30 days supply | Qty: 30 | Fill #1

## 2015-12-29 MED FILL — predniSONE 1 MG TABS: 1 | 30 days supply | Qty: 30 | Fill #2

## 2016-02-07 MED FILL — predniSONE 1 MG TABS: 1 | 30 days supply | Qty: 30 | Fill #3

## 2016-02-15 MED FILL — OMEPRAZOLE DR 20 MG CAPSULE: 20 | 30 days supply | Qty: 30 | Fill #0

## 2016-02-15 MED FILL — predniSONE 5 MG TABS: 5 | 31 days supply | Qty: 180 | Fill #0

## 2016-02-29 MED FILL — METHOTREXATE 25 MG/ML VIAL: 50 | 28 days supply | Qty: 4 | Fill #0

## 2016-03-07 MED FILL — predniSONE 20 MG TABS: 20 | 30 days supply | Qty: 90 | Fill #0

## 2016-03-10 MED FILL — MAGIC MOUTHWASH BOP FORM: 5 days supply | Qty: 240 | Fill #0

## 2016-03-10 MED FILL — FOLIC ACID 1 MG TABLET: 1 | 30 days supply | Qty: 60 | Fill #0

## 2016-03-14 MED FILL — LIDOCAINE HCL 4% SOLUTION: 4 | 14 days supply | Qty: 50 | Fill #0

## 2016-03-14 MED FILL — OMEPRAZOLE DR 20 MG CAPSULE: 20 | 30 days supply | Qty: 30 | Fill #1

## 2016-03-24 MED FILL — predniSONE 10 MG TABS: 10 | 30 days supply | Qty: 106 | Fill #0

## 2016-03-27 MED FILL — FOLIC ACID 1 MG TABLET: 1 | 30 days supply | Qty: 60 | Fill #1

## 2016-04-04 MED FILL — predniSONE 20 MG TABS: 20 | 30 days supply | Qty: 120 | Fill #0

## 2016-04-04 MED FILL — METHOTREXATE 25 MG/ML VIAL: 50 | 21 days supply | Qty: 4 | Fill #0

## 2016-05-03 MED FILL — METHOTREXATE 25 MG/ML VIAL: 50 | 21 days supply | Qty: 4 | Fill #1

## 2016-05-03 MED FILL — predniSONE 20 MG TABS: 20 | 30 days supply | Qty: 120 | Fill #1

## 2016-05-08 MED FILL — OSELTAMIVIR PHOS 75 MG CAP: 75 | 5 days supply | Qty: 10 | Fill #0

## 2016-05-16 MED FILL — AZITHROMYCIN 250 MG TABLET: 250 | 5 days supply | Qty: 6 | Fill #0

## 2016-05-24 MED FILL — predniSONE 20 MG TABS: 20 | 31 days supply | Qty: 76 | Fill #0

## 2016-05-24 MED FILL — METHOTREXATE 25 MG/ML VIAL: 50 | 21 days supply | Qty: 4 | Fill #0

## 2016-05-24 MED FILL — NYSTATIN 100,000 UNITS/ML S: 100000 | 10 days supply | Qty: 200 | Fill #0

## 2016-06-19 MED FILL — METHOTREXATE 25 MG/ML VIAL: 50 | 21 days supply | Qty: 4 | Fill #1

## 2016-07-12 MED FILL — predniSONE 10 MG TABS: 10 | 30 days supply | Qty: 106 | Fill #1

## 2016-07-17 MED FILL — METHOTREXATE 25 MG/ML VIAL: 50 | 21 days supply | Qty: 4 | Fill #2

## 2016-07-24 ENCOUNTER — Other Ambulatory Visit (HOSPITAL_COMMUNITY): Payer: Self-pay | Admitting: Internal Medicine

## 2016-07-24 DIAGNOSIS — R1310 Dysphagia, unspecified: Secondary | ICD-10-CM

## 2016-07-27 MED FILL — predniSONE 5 MG TABS: 5 | 30 days supply | Qty: 150 | Fill #0

## 2016-07-28 ENCOUNTER — Ambulatory Visit (HOSPITAL_COMMUNITY): Payer: 59

## 2016-07-28 ENCOUNTER — Other Ambulatory Visit (HOSPITAL_COMMUNITY): Payer: Self-pay | Admitting: Internal Medicine

## 2016-07-28 ENCOUNTER — Ambulatory Visit (HOSPITAL_COMMUNITY)
Admission: RE | Admit: 2016-07-28 | Discharge: 2016-07-28 | Disposition: A | Discharge status: Home / Self Care | Payer: 59 | Source: Ambulatory Visit | Attending: Internal Medicine | Admitting: Internal Medicine

## 2016-07-28 DIAGNOSIS — R1310 Dysphagia, unspecified: Secondary | ICD-10-CM | POA: Diagnosis present

## 2016-07-28 DIAGNOSIS — M3390 Dermatopolymyositis, unspecified, organ involvement unspecified: Secondary | ICD-10-CM | POA: Insufficient documentation

## 2016-07-28 NOTE — Progress Notes (Signed)
Modified Barium Swallow Progress Note  Patient Details  Name: Anthoney Sheppard MRN: [id number] Date of Birth: 02-18-1961  Today's Date: 07/28/2016  Modified Barium Swallow completed.  Full report located under Chart Review in the Imaging Section.  Brief recommendations include the following:  Clinical Impression  Pt has a mild pharyngeal and cervical esophageal dysphagia that is characterized by reduced base of tongue retraction, anterior laryngeal movement, and UES relaxation. This results in mild-moderate vallecuale residue wtih regular textures that the pt senses and uses several liquid washes to clear. He also has a trace amount of thin liquid residue that remains within the cervical esophagus, that is not as evident with heavier, solid boluses. Despite the above, his airway remains well protected. Encouraged pt to continue with regular textures and thin liquids as tolerated with use of liquid wash PRN. He would benefit from OP SLP f/u for consideration of pharyngeal strengthening exercises and maintenance program, particularly in light of his h/o radiation tx.    Swallow Evaluation Recommendations       SLP Diet Recommendations: Regular solids;Thin liquid   Liquid Administration via: Cup;Straw   Medication Administration: Whole meds with liquid (use puree PRN)   Supervision: Patient able to self feed   Compensations: Slow rate;Small sips/bites;Follow solids with liquid   Postural Changes: Seated upright at 90 degrees;Remain semi-upright after after feeds/meals (Comment)   Oral Care Recommendations: Oral care BID        Germain Osgood 07/28/2016,2:27 PM   Germain Osgood, M.A. CCC-SLP [phone]

## 2016-08-09 ENCOUNTER — Ambulatory Visit: Payer: 59 | Attending: Internal Medicine

## 2016-08-09 DIAGNOSIS — R490 Dysphonia: Secondary | ICD-10-CM | POA: Insufficient documentation

## 2016-08-09 DIAGNOSIS — C01 Malignant neoplasm of base of tongue: Secondary | ICD-10-CM | POA: Insufficient documentation

## 2016-08-09 DIAGNOSIS — R1313 Dysphagia, pharyngeal phase: Secondary | ICD-10-CM | POA: Diagnosis not present

## 2016-08-09 NOTE — Patient Instructions (Signed)
SWALLOWING EXERCISES Do these 6 of the 7 days per week for 8 weeks, then 2 times per week afterwards  1. Effortful Swallows - Press your tongue against the roof of your mouth for 3 seconds, then squeeze the muscles in your neck while you swallow your saliva or a sip of water - Repeat 10 times, 3-5 times a day, and use whenever you eat or drink  2. Masako Swallow - swallow with your tongue sticking out - Stick tongue out past your teeth and gently bite tongue with your teeth - Swallow, while holding your tongue with your teeth - Repeat 10 times, 3-5 times a day *use a wet spoon if your mouth gets dry*  3. Shaker Exercise - head lift - Lie flat on your back in your bed or on a couch without pillows - Raise your head and look at your feet - KEEP YOUR SHOULDERS DOWN - HOLD FOR 45-60 SECONDS, then lower your head back down - Repeat 3 times, 2-3 times a day  4. Mendelsohn Maneuver - "half swallow" exercise - Start to swallow, and keep your Adam's apple up by squeezing hard with the            muscles of the throat - Hold the squeeze for 5-7 seconds and then relax - Repeat 10 times, 3-5 times a day *use a wet spoon if your mouth gets dry*  5. Breath Hold - Say "HUH!" loudly, then hold your breath for 3 seconds at your voice box - Repeat 10 times, 3-5 times a day  6. Chin pushback - Open your mouth  - Place your fist UNDER your chin near your neck, and push back with your fist for 5 seconds - Repeat 5-7 times, 3-5 times a day        9.  "Gargle" exercise  Pretend to gargle for 2-3 seconds  Repeat 8-10 times, 3-5 times a day        10. Supraglottic swallow  Take a breath & hold it, swallow and cough immediately  Repeat 5 times, 3-5 times per day

## 2016-08-11 NOTE — Therapy (Signed)
[facility] [address] [place], [name], [address] Phone: [phone]   Fax:  [phone]  Speech Language Pathology Evaluation  Patient Details  Name: Eric Martinez MRN: [id number] Date of Birth: [birth year] Referring Provider: Arlana Pouch, MD  Encounter Date: 08/09/2016      End of Session - 08/11/16 1657    Visit Number 1   Number of Visits 9   Date for SLP Re-Evaluation 10/20/16   SLP Start Time 0850   SLP Stop Time  0931   SLP Time Calculation (min) 41 min   Activity Tolerance Patient tolerated treatment well      Past Medical History:  Diagnosis Date  . Hyperlipidemia   . Myositis     Past Surgical History:  Procedure Laterality Date  . APPENDECTOMY    . MINOR MUSCLE BIOPSY Right 11/18/2013   Procedure: MINOR MUSCLE BIOPSY;  Surgeon: Edward Jolly, MD;  Location: [facility];  Service: General;  Laterality: Right;    There were no vitals filed for this visit.          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 Regular/thin recommended with small sips/bites, slow rate of eating, alternate solid/liquid 1:1 ratio, remain upright for 30-60 minutes post-meal. 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 He had brief OP SLP f/u and use of HEP to facilitate transition back to eating a regular diet. Since that time he was diagnosed with base of tongue cancer and underwent  chemo/XRT, completed in Fall 2017. He does not endorse any trouble maintaining a PO diet through tx and did not need alternative feeding methods, although he does report persistent xerostomia. Since January of this year he has been experiencing difficulties primarily with solid, dry foods. Pt also reports hoarseness in his voice that was almost aphonic in December 201-January 2018. Voice has little stamina, pt returns to work in [employer] next week.     Cognition   Overall Cognitive Status Within Functional Limits for tasks assessed     Auditory Comprehension   Overall Auditory Comprehension Appears within functional limits for tasks assessed     Verbal Expression   Overall Verbal Expression Appears within functional limits for tasks assessed     Oral Motor/Sensory Function   Overall Oral Motor/Sensory Function Impaired   Labial ROM Within Functional Limits   Labial Strength Reduced   Lingual ROM Within Functional Limits   Lingual Strength Reduced     Motor Speech   Respiration --  chest breather in conversation   Phonation Hoarse  mildly hoarse   Articulation Within functional limitis   Intelligibility Intelligible   Motor Planning Witnin functional limits      Pt currently tolerates regular diet with thin liquids. Pt recalled some of his swallow precautions (did not recall alternate bite/sip). [name] cough and throat clear are WNL. He has adequate natural dentition. No overt s/s aspiration were noted nor reported  today. No palpable muscle fibrosis in expected areas post-rad tx for tongue base CA.  SLP educated pt re: changes to swallowing musculature after rad tx, and why adherence to dysphagia HEP provided today was necessary to inhibit/reduce muscle fibrosis following rad tx. Pt demonstrated understanding of these things to SLP.    SLP then developed a HEP for pt and pt was instructed how to perform exercises involving lingual, vocal, and pharyngeal strengthening. SLP performed each  exercise and pt return demonstrated each exercise. SLP ensured pt performance was correct prior to moving on to next exercise. Pt was instructed to complete this program 2-3 times a day, for the next 6 months, then x2 a week after that.  Pt would also benefit from abdominal breathing training, however this script today is specifically written for dysphagia. SLP will inquire of referring MD for need for breath support training as pt states he fatigues easily with conversation. He is in [employer] and is returning to work next week.                       SLP Short Term Goals - 08/09/16 1734      SLP SHORT TERM GOAL #1   Title pt will perform dysphagia HEP wiht rare min A over two sessions   Time 4   Period Weeks   Status New     SLP SHORT TERM GOAL #2   Title pt will follow swallow strategies with rare min A   Time 4   Period Weeks   Status New          SLP Long Term Goals - 08/09/16 1734      SLP LONG TERM GOAL #1   Title pt will demo swallow precautions with POs to decr aspiration risk over 2 sessions   Time 8   Period Weeks   Status New     SLP LONG TERM GOAL #2   Title pt will complete HEP with modified independence over two sessions to improve swallow muscle strength to minimize risk of aspiration   Time 8   Period Weeks   Status New          Plan - 08/11/16 1657    Clinical Impression Statement Pt presents today with oropharyngeal swallowing deficits as ID'd on modified barium swallow eval of 07-28-16 -see those results under "imaging" for details. Pt recommended regular/thin with small bites/sips, slow rate, upright 30-60 minutes after meals, alternate bite/sip. Pt well-known to this SLP due to course of dysphagia tx in 2015 after diagnosis of dermatomyositis. Since that time pt has also undergone chemoradiation for tongue base cancer, completed 11-19-15. Pt did not see SLP during that course of rad tx. Lastly pt exhibits mild-mod hoarse voice and inquires  about vocal strengthening exercises. SLP will inquire pt's referring MD re: script for this. Pt would beneift from skilled ST focusing on improvement and strengthening of swallowing musculuature. ST for voice strengthening/efficiency (abdominal breathing) may also occur after that, as SLP noted pt is chest breather in conversation, and states he fatigues very quickly in conversation. Pt is to return to work in [employer] next week.    Speech Therapy Frequency 1x /week   Duration --  8 weeks   Treatment/Interventions Aspiration precaution training;Pharyngeal strengthening exercises;Diet toleration management by SLP;Compensatory techniques;SLP instruction and feedback;Patient/family education;Cueing hierarchy  any or all may be used   Potential to Achieve Goals Good   Potential Considerations Co-morbidities  hx of head/neck cancer, dx  of dermatmyositis   SLP Home Exercise Plan provided today   Consulted and Agree with Plan of Care Patient      Patient will benefit from skilled therapeutic intervention in order to improve the following deficits and impairments:   Pharyngeal dysphagia  Dysphonia    Problem List Patient Active Problem List   Diagnosis Date Noted  . Cancer of base of tongue ([facility]) 08/09/2016  . Need for tetanus booster 06/20/2015  . Puncture wound of foot 06/20/2015  . Muscle weakness 12/17/2013    [facility] ,MS, CCC-SLP  08/11/2016, 4:58 PM  [facility] [address] [place], [name], [address] Phone: [phone]   Fax:  [phone]  Name: Eric Martinez MRN: [id number] Date of Birth: [birth year]

## 2016-08-15 ENCOUNTER — Ambulatory Visit: Payer: 59

## 2016-08-15 DIAGNOSIS — R490 Dysphonia: Secondary | ICD-10-CM

## 2016-08-15 DIAGNOSIS — R1313 Dysphagia, pharyngeal phase: Secondary | ICD-10-CM | POA: Diagnosis not present

## 2016-08-15 MED FILL — METHOTREXATE 25 MG/ML VIAL: 50 | 21 days supply | Qty: 4 | Fill #3

## 2016-08-15 NOTE — Patient Instructions (Signed)
Complete exercises TWICE each day!

## 2016-08-15 NOTE — Therapy (Signed)
[facility] [address] [place], [name], [address] Phone: [phone]   Fax:  [phone]  Speech Language Pathology Treatment  Patient Details  Name: Eric Martinez MRN: [id number] Date of Birth: 1960-11-19 Referring Provider: Arlana Pouch, MD  Encounter Date: 08/15/2016      End of Session - 08/15/16 1703    Visit Number 2   Number of Visits 9   Date for SLP Re-Evaluation 10/20/16   SLP Start Time 0933   SLP Stop Time  1009   SLP Time Calculation (min) 36 min   Activity Tolerance Patient tolerated treatment well      Past Medical History:  Diagnosis Date  . Hyperlipidemia   . Myositis     Past Surgical History:  Procedure Laterality Date  . APPENDECTOMY    . MINOR MUSCLE BIOPSY Right 11/18/2013   Procedure: MINOR MUSCLE BIOPSY;  Surgeon: Edward Jolly, MD;  Location: [facility];  Service: General;  Laterality: Right;    There were no vitals filed for this visit.             ADULT SLP TREATMENT - 08/15/16 1656      General Information   Behavior/Cognition Alert;Cooperative;Pleasant mood     Treatment Provided   Treatment provided Dysphagia     Dysphagia Treatment   Temperature Spikes Noted No   Respiratory Status Room air   Oral Cavity - Dentition Adequate natural dentition   Treatment Methods Skilled observation;Patient/caregiver education   Patient observed directly with PO's Yes   Type of PO's observed Thin liquids   Feeding Able to feed self   Liquids provided via --  bottle   Oral Phase Signs & Symptoms --  none   Pharyngeal Phase Signs & Symptoms Other (comment)  consistent throat clearing   Type of cueing Verbal;Visual   Amount of cueing Minimal   Other treatment/comments Pt req'd min A rarely for HEP today, SLP provided pt with rationale for each exercise due to pt completing HEP once/day with piecemealing another fraction of the exericises.  SLP reminded pt of muscle fibrosis as well and need to do full HEP twice/day.      Pain Assessment   Pain Assessment No/denies pain     Assessment / Recommendations / Plan   Plan Continue with current plan of care     Dysphagia Recommendations   Diet recommendations Regular;Thin liquid   Liquids provided via Cup   Medication Administration --  per MBSS   Compensations --  per MBSS     Progression Toward Goals   Progression toward goals Progressing toward goals          SLP Education - 08/15/16 1702    Education provided Yes   Education Details HEP procedure, muscle fibrosis after head/neck radiation, HEP rationale   Person(s) Educated Patient   Methods Explanation;Demonstration   Comprehension Verbalized understanding          SLP Short Term Goals - 08/15/16 1707      SLP SHORT TERM GOAL #1   Title pt will perform dysphagia HEP wiht rare min A over two sessions   Baseline 08-15-16   Time 4   Period Weeks   Status On-going     SLP SHORT TERM GOAL #2   Title pt will follow swallow strategies with rare min A   Time 4   Period Weeks   Status On-going          SLP  Long Term Goals - 08/15/16 1707      SLP LONG TERM GOAL #1   Title pt will demo swallow precautions with POs to decr aspiration risk over 2 sessions   Time 8   Period Weeks   Status On-going     SLP LONG TERM GOAL #2   Title pt will complete HEP with modified independence over two sessions to improve swallow muscle strength to minimize risk of aspiration   Time 8   Period Weeks   Status On-going          Plan - 08/15/16 1704    Clinical Impression Statement Pt req'd rare min A for procedure for HEP today. Pt cont to exhibit mild-mod hoarse voice and inquires about vocal strengthening exercises. SLP spoke with pt's referring MD's RN re: script for this. Pt may or may not desire to undergo vocal strenngthening exercises and/or vocal efficiency training. SLP strongly suggested pt have follow up  with ENT due to hx of head/neck CA. Beneift from skilled ST continues, focusing on improvement and strengthening of swallowing musculuature. ST for voice strengthening/efficiency (abdominal breathing) may also occur after that, as SLP noted pt is chest breather in conversation, and states he fatigues very quickly in conversation. Pt is to return to work in [employer] next week.    Speech Therapy Frequency 1x /week   Duration --  8 weeks   Treatment/Interventions Aspiration precaution training;Pharyngeal strengthening exercises;Diet toleration management by SLP;Compensatory techniques;SLP instruction and feedback;Patient/family education;Cueing hierarchy  any or all may be used   Potential to Achieve Goals Good   Potential Considerations Co-morbidities  hx of head/neck cancer, dx of dermatmyositis   SLP Home Exercise Plan provided today   Consulted and Agree with Plan of Care Patient      Patient will benefit from skilled therapeutic intervention in order to improve the following deficits and impairments:   Pharyngeal dysphagia  Dysphonia    Problem List Patient Active Problem List   Diagnosis Date Noted  . Cancer of base of tongue ([facility]) 08/09/2016  . Need for tetanus booster 06/20/2015  . Puncture wound of foot 06/20/2015  . Muscle weakness 12/17/2013    [facility] ,[place], CCC-SLP  08/15/2016, 5:08 PM  [facility] [address] [place], [name], [address] Phone: [phone]   Fax:  [phone]   Name: Eric Martinez MRN: [id number] Date of Birth: 10/25/1960

## 2016-08-22 ENCOUNTER — Ambulatory Visit: Payer: 59 | Attending: Internal Medicine

## 2016-08-22 DIAGNOSIS — R490 Dysphonia: Secondary | ICD-10-CM | POA: Insufficient documentation

## 2016-08-22 DIAGNOSIS — R1313 Dysphagia, pharyngeal phase: Secondary | ICD-10-CM | POA: Diagnosis present

## 2016-08-22 NOTE — Therapy (Signed)
[facility] [address] [place], [name], [address] Phone: [phone]   Fax:  [phone]  Speech Language Pathology Treatment  Patient Details  Name: Eric Martinez MRN: [id number] Date of Birth: [birth year] Referring Provider: Arlana Pouch, MD  Encounter Date: 08/22/2016      End of Session - 08/22/16 1719    Visit Number 3   Number of Visits 9   Date for SLP Re-Evaluation 10/20/16   SLP Start Time 0933   SLP Stop Time  1015   SLP Time Calculation (min) 42 min   Activity Tolerance Patient tolerated treatment well      Past Medical History:  Diagnosis Date  . Hyperlipidemia   . Myositis     Past Surgical History:  Procedure Laterality Date  . APPENDECTOMY    . MINOR MUSCLE BIOPSY Right 11/18/2013   Procedure: MINOR MUSCLE BIOPSY;  Surgeon: Edward Jolly, MD;  Location: [facility];  Service: General;  Laterality: Right;    There were no vitals filed for this visit.      Subjective Assessment - 08/22/16 0938    Subjective Pt would like to wait until follow up with rad onc in August for visualization of layrnx. "The exercsies (swallowing) are getting easier."   Currently in Pain? No/denies               ADULT SLP TREATMENT - 08/22/16 0939      General Information   Behavior/Cognition Alert;Cooperative;Pleasant mood     Treatment Provided   Treatment provided Cognitive-Linquistic     Cognitive-Linquistic Treatment   Treatment focused on Voice   Skilled Treatment Abnormal breathing pattern - during reading, sometimes pt took breath and took another one word later and breathing on residual volume rarely. In conversation of 7 minutes pt only spoke on residual volume once, and was without other breathing anomalies noted in reading. Pt had approx 25% abdominal breathing in converastion. SLP educated and taught pt about abdominal breathing (AB) today. s/z ratio was  0.84 or WNL. Pt sustained /a/ for average 12.6 seconds average (below WNL).      Assessment / Recommendations / Plan   Plan Continue with current plan of care;Goals updated  for voice goal/s     Progression Toward Goals   Progression toward goals Progressing toward goals            SLP Short Term Goals - 08/22/16 1725      SLP SHORT TERM GOAL #1   Title pt will perform dysphagia HEP wiht rare min A over two sessions   Baseline 08-15-16   Time 3   Period Weeks   Status On-going     SLP SHORT TERM GOAL #2   Title pt will follow swallow strategies with rare min A   Time 3   Period Weeks   Status On-going     SLP SHORT TERM GOAL #3   Title pt will name 3 vocal hygiene strategies   Time 3   Period Weeks   Status New     SLP SHORT TERM GOAL #4   Title pt will demo abdominal breathing (AB) in 18/20 sentence tasks over two sessions   Time 3   Period Weeks   Status New          SLP Long Term Goals - 08/22/16 1726      SLP LONG TERM GOAL #1   Title pt will demo swallow precautions with POs to  decr aspiration risk over 2 sessions   Time 7   Period Weeks   Status On-going     SLP LONG TERM GOAL #2   Title pt will complete HEP with modified independence over two sessions to improve swallow muscle strength to minimize risk of aspiration   Time 7   Period Weeks   Status On-going     SLP LONG TERM GOAL #3   Title pt will demo AB 80% of the time in 10 minutes mod complex conversation with modified indpendence   Time 7   Period Weeks   Status New     SLP LONG TERM GOAL #4   Title pt will report improvement in vocal quality compared to pre-ST   Time 7   Period Weeks   Status New          Plan - 08/22/16 1720    Clinical Impression Statement Rec'd script for voice therapy. Despite SLP suggestion of ENT referral for visualization of pt's laryngeal area, pt does not want to see [facility] ENT at this time but wait until he is scoped at rad onc office in August for  visualization of larynx at that time. [name] s/z ratio is WNL, however sustained /a/ is below WNL. Will add goal for vocal hygiene and for abdominal breathing, which was taught today to pt. He will cont dysphagia/vocal strengthening exercises of vocal adduction and supraglottic swallow. Beneift from skilled ST continues, focusing on improvement and strengthening of swallowing musculuature and improvement of pt's voice from vocal hygiene, abdominal breathing, and vocal exercises already provided on pt's dysphagia HEP.   Speech Therapy Frequency 1x /week   Duration --  8 weeks   Treatment/Interventions Aspiration precaution training;Pharyngeal strengthening exercises;Diet toleration management by SLP;Compensatory techniques;SLP instruction and feedback;Patient/family education;Cueing hierarchy  any or all may be used   Potential to Achieve Goals Good   Potential Considerations Co-morbidities  hx of head/neck cancer, dx of dermatmyositis   SLP Home Exercise Plan provided today   Consulted and Agree with Plan of Care Patient      Patient will benefit from skilled therapeutic intervention in order to improve the following deficits and impairments:   Pharyngeal dysphagia  Dysphonia    Problem List Patient Active Problem List   Diagnosis Date Noted  . Cancer of base of tongue ([facility]) 08/09/2016  . Need for tetanus booster 06/20/2015  . Puncture wound of foot 06/20/2015  . Muscle weakness 12/17/2013    [facility] ,MS, CCC-SLP  08/22/2016, 5:27 PM  [facility] [address] [place], [name], [address] Phone: [phone]   Fax:  [phone]   Name: Eric Martinez MRN: [id number] Date of Birth: [birth year]

## 2016-08-30 ENCOUNTER — Ambulatory Visit: Payer: 59

## 2016-08-30 DIAGNOSIS — R1313 Dysphagia, pharyngeal phase: Secondary | ICD-10-CM | POA: Diagnosis not present

## 2016-08-30 DIAGNOSIS — R490 Dysphonia: Secondary | ICD-10-CM

## 2016-08-30 NOTE — Patient Instructions (Signed)
Practice abdominal breathing 15 minutes twice a day  Use an earpiece for phone conversations in your car with volume increased to make you talk softer  Increase your water consumption to 64 oz/day.

## 2016-08-30 NOTE — Therapy (Signed)
[facility] [address] [place], [name], [address] Phone: [phone]   Fax:  [phone]  Speech Language Pathology Treatment  Patient Details  Name: Eric Martinez MRN: [id number] Date of Birth: 1961-03-10 Referring Provider: Arlana Pouch, MD  Encounter Date: 08/30/2016      End of Session - 08/30/16 1404    Visit Number 4   Number of Visits 9   Date for SLP Re-Evaluation 10/20/16   SLP Start Time 70   SLP Stop Time  1218   SLP Time Calculation (min) 30 min   Activity Tolerance Patient tolerated treatment well      Past Medical History:  Diagnosis Date  . Hyperlipidemia   . Myositis     Past Surgical History:  Procedure Laterality Date  . APPENDECTOMY    . MINOR MUSCLE BIOPSY Right 11/18/2013   Procedure: MINOR MUSCLE BIOPSY;  Surgeon: Edward Jolly, MD;  Location: [facility];  Service: General;  Laterality: Right;    There were no vitals filed for this visit.      Subjective Assessment - 08/30/16 1359    Subjective Pt arrives today with his HEP for swallowing. Reports he has practiced abdominal breathing (AB) approx 5 minutes once a day.   Currently in Pain? No/denies               ADULT SLP TREATMENT - 08/30/16 1359      General Information   Behavior/Cognition Alert;Cooperative;Pleasant mood     Treatment Provided   Treatment provided Cognitive-Linquistic     Dysphagia Treatment   Other treatment/comments Pt without questions re: dysphagia exercises.     Cognitive-Linquistic Treatment   Treatment focused on Voice   Skilled Treatment Pt practiced AB and was over inspiring and blowing breath out. SLP drew pt diagram of full breath in lungs and the fraction that is used with speech. SLP told pt to breath in his regular way and to note the extent of his breath. Then told pt to match that level of inspiration with AB. Pt did so with excellent success.       Assessment / Recommendations / Plan   Plan Continue with current plan of care     Progression Toward Goals   Progression toward goals Progressing toward goals          SLP Education - 08/30/16 1403    Education provided Yes   Education Details vocal hygiene measures, abdominal breathing   Person(s) Educated Patient   Methods Explanation   Comprehension Verbalized understanding          SLP Short Term Goals - 08/30/16 1406      SLP SHORT TERM GOAL #1   Title pt will perform dysphagia HEP wiht rare min A over two sessions   Baseline 08-15-16   Time 2   Period Weeks   Status On-going     SLP SHORT TERM GOAL #2   Title pt will follow swallow strategies with rare min A   Time 2   Period Weeks   Status On-going     SLP SHORT TERM GOAL #3   Title pt will name 3 vocal hygiene strategies   Time 2   Period Weeks   Status On-going     SLP SHORT TERM GOAL #4   Title pt will demo abdominal breathing (AB) in 18/20 sentence tasks over two sessions   Time 2   Period Weeks   Status On-going  SLP Long Term Goals - 08/30/16 1406      SLP LONG TERM GOAL #1   Title pt will demo swallow precautions with POs to decr aspiration risk over 2 sessions   Time 6   Period Weeks   Status On-going     SLP LONG TERM GOAL #2   Title pt will complete HEP with modified independence over two sessions to improve swallow muscle strength to minimize risk of aspiration   Time 6   Period Weeks   Status On-going     SLP LONG TERM GOAL #3   Title pt will demo AB 80% of the time in 10 minutes mod complex conversation with modified indpendence   Time 6   Period Weeks   Status New     SLP LONG TERM GOAL #4   Title pt will report improvement in vocal quality compared to pre-ST   Time 6   Period Weeks   Status New          Plan - 08/30/16 1404    Clinical Impression Statement [name] practicing of abdominal breathing (AB) was suboptimal. SLP taught pt levels for  inspiration today (pt was over-inflating lungs on inspiration with AB). Beneift from skilled ST continues, focusing on improvement and strengthening of swallowing musculuature and improvement of pt's voice from vocal hygiene, abdominal breathing, and vocal exercises already provided on pt's dysphagia HEP.   Speech Therapy Frequency 1x /week   Duration --  8 weeks   Treatment/Interventions Aspiration precaution training;Pharyngeal strengthening exercises;Diet toleration management by SLP;Compensatory techniques;SLP instruction and feedback;Patient/family education;Cueing hierarchy  any or all may be used   Potential to Achieve Goals Good   Potential Considerations Co-morbidities  hx of head/neck cancer, dx of dermatmyositis   SLP Home Exercise Plan provided today   Consulted and Agree with Plan of Care Patient      Patient will benefit from skilled therapeutic intervention in order to improve the following deficits and impairments:   Dysphonia  Pharyngeal dysphagia    Problem List Patient Active Problem List   Diagnosis Date Noted  . Cancer of base of tongue ([facility]) 08/09/2016  . Need for tetanus booster 06/20/2015  . Puncture wound of foot 06/20/2015  . Muscle weakness 12/17/2013    [facility] ,MS, CCC-SLP  08/30/2016, 2:06 PM  [facility] [address] [place], [name], [address] Phone: [phone]   Fax:  [phone]   Name: Eric Martinez MRN: [id number] Date of Birth: 1960-10-21

## 2016-09-05 ENCOUNTER — Ambulatory Visit: Payer: 59 | Admitting: Speech Pathology

## 2016-09-05 DIAGNOSIS — R1313 Dysphagia, pharyngeal phase: Secondary | ICD-10-CM

## 2016-09-05 DIAGNOSIS — R490 Dysphonia: Secondary | ICD-10-CM

## 2016-09-05 NOTE — Therapy (Signed)
[facility] [address] [place], [name], [address] Phone: [phone]   Fax:  [phone]  Speech Language Pathology Treatment  Patient Details  Name: Eric Martinez MRN: [id number] Date of Birth: November 12, 1960 Referring Provider: Arlana Pouch, MD  Encounter Date: 09/05/2016      End of Session - 09/05/16 1802    Visit Number 5   Number of Visits 9   Date for SLP Re-Evaluation 10/20/16   SLP Start Time 0935   SLP Stop Time  1015   SLP Time Calculation (min) 40 min   Activity Tolerance Patient tolerated treatment well      Past Medical History:  Diagnosis Date  . Hyperlipidemia   . Myositis     Past Surgical History:  Procedure Laterality Date  . APPENDECTOMY    . MINOR MUSCLE BIOPSY Right 11/18/2013   Procedure: MINOR MUSCLE BIOPSY;  Surgeon: Edward Jolly, MD;  Location: [facility];  Service: General;  Laterality: Right;    There were no vitals filed for this visit.      Subjective Assessment - 09/05/16 0935    Subjective "At the end of the day it gets weak. I wanted to see what kind of exercises we could get to help." re: voice   Currently in Pain? No/denies               ADULT SLP TREATMENT - 09/05/16 0935      General Information   Behavior/Cognition Alert;Cooperative;Pleasant mood   Patient Positioning Upright in chair   Oral care provided N/A     Treatment Provided   Treatment provided Cognitive-Linquistic     Pain Assessment   Pain Assessment No/denies pain     Cognitive-Linquistic Treatment   Treatment focused on Voice   Skilled Treatment Continued training in AB, and instructed in neck/jaw stretches to release tension. Pt with good success implementing AB at phrase level with occasional min A for vocal quality reduced tension. Education re: ongoing effects of BOT radiation and encouraged pt to adhere to swallowing exercise routine to prevent  functional decline. Measured pt's jaw ROM today which is within normal limits (68mm; above 40 is [facility]). Provided verbal and written education re: vocal hygiene, drawing particular attention to pt's throat clearing behavior, which he demonstrated >20 times during today's session.      Assessment / Recommendations / Plan   Plan Continue with current plan of care     Progression Toward Goals   Progression toward goals Progressing toward goals          SLP Education - 09/05/16 1801    Education provided Yes   Education Details vocal hygiene, AB, ongoing/late effects of XRT on swallow fx, phonotraumatic behaviors, may benefit from ENT referral   Person(s) Educated Patient   Methods Explanation   Comprehension Verbalized understanding          SLP Short Term Goals - 09/05/16 1804      SLP SHORT TERM GOAL #1   Title pt will perform dysphagia HEP wiht rare min A over two sessions   Baseline 08-15-16,    Time 1   Period Weeks   Status On-going     SLP SHORT TERM GOAL #2   Title pt will follow swallow strategies with rare min A   Time 1   Period Weeks   Status On-going     SLP SHORT TERM GOAL #3   Title pt will name 3 vocal hygiene  strategies   Time 1   Period Weeks   Status On-going          SLP Long Term Goals - 09/05/16 1805      SLP LONG TERM GOAL #1   Title pt will demo swallow precautions with POs to decr aspiration risk over 2 sessions   Time 5   Period Weeks   Status On-going     SLP LONG TERM GOAL #2   Title pt will complete HEP with modified independence over two sessions to improve swallow muscle strength to minimize risk of aspiration   Time 5   Period Weeks   Status On-going     SLP LONG TERM GOAL #3   Title pt will demo AB 80% of the time in 10 minutes mod complex conversation with modified indpendence   Time 5   Period Weeks   Status On-going          Plan - 09/05/16 1803    Clinical Impression Statement Pt demo'd increased awareness of  phonotraumatic behaviors after instruction and initial consistent SLP cues. 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 Benefit from skilled ST continues, focusing on improvement and strengthening of swallowing musculuature and improvement of pt's voice from vocal hygiene, abdominal breathing, and vocal exercises already provided on pt's dysphagia HEP.   Speech Therapy Frequency 1x /week   Treatment/Interventions Aspiration precaution training;Pharyngeal strengthening exercises;Diet toleration management by SLP;Compensatory techniques;SLP instruction and feedback;Patient/family education;Cueing hierarchy   Potential to Achieve Goals Good   Potential Considerations Co-morbidities   SLP Home Exercise Plan provided today   Consulted and Agree with Plan of Care Patient      Patient will benefit from skilled therapeutic intervention in order to improve the following deficits and impairments:   Dysphonia  Pharyngeal dysphagia    Problem List Patient Active Problem List   Diagnosis Date Noted  . 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 Need for tetanus booster 06/20/2015  . Puncture wound of foot 06/20/2015  . Muscle weakness 12/17/2013   [name], [place], CCC-SLP Speech-Language Pathologist  Aliene Altes 09/05/2016, 6:06 PM  [facility] [address] [place], [name], [address] Phone: [phone]   Fax:  [phone]   Name: Tjay Velazquez MRN: [id number] Date of Birth: March 18, 1961

## 2016-09-11 MED FILL — METHOTREXATE 25 MG/ML VIAL: 50 | 21 days supply | Qty: 4 | Fill #4

## 2016-09-22 ENCOUNTER — Ambulatory Visit: Payer: 59 | Attending: Internal Medicine

## 2016-10-03 MED FILL — METHOTREXATE 25 MG/ML VIAL: 50 | 21 days supply | Qty: 4 | Fill #5

## 2016-10-12 MED FILL — predniSONE 10 MG TABS: 10 | 30 days supply | Qty: 106 | Fill #2

## 2016-11-01 MED FILL — METHOTREXATE 25 MG/ML VIAL: 50 | 21 days supply | Qty: 4 | Fill #6

## 2016-11-14 MED FILL — predniSONE 5 MG TABS: 5 | 30 days supply | Qty: 45 | Fill #0

## 2016-11-22 DIAGNOSIS — M3390 Dermatopolymyositis, unspecified, organ involvement unspecified: Secondary | ICD-10-CM | POA: Diagnosis not present

## 2016-11-23 DIAGNOSIS — M3390 Dermatopolymyositis, unspecified, organ involvement unspecified: Secondary | ICD-10-CM | POA: Diagnosis not present

## 2016-11-27 MED FILL — METHOTREXATE 25 MG/ML VIAL: 50 | 21 days supply | Qty: 4 | Fill #7

## 2016-11-30 ENCOUNTER — Encounter: Payer: Self-pay | Admitting: Speech Pathology

## 2016-11-30 NOTE — Therapy (Signed)
SPEECH THERAPY DISCHARGE SUMMARY  Visits from Start of Care: 6  Current functional level related to goals / functional outcomes: Long term goals not met due to pt not returning since last visit 09/05/16.    Remaining deficits: At time of last visit, pt continued to display deficits in swallowing, voice.   Education / Equipment: HEP as provided.  Plan: Patient agrees to discharge.  Patient goals were not met. Patient is being discharged due to not returning since the last visit.  ?????        SLP Long Term Goals - 09/05/16 1805      SLP LONG TERM GOAL #1   Title pt will demo swallow precautions with POs to decr aspiration risk over 2 sessions   Time 5   Period Weeks   Status On-going     SLP LONG TERM GOAL #2   Title pt will complete HEP with modified independence over two sessions to improve swallow muscle strength to minimize risk of aspiration   Time 5   Period Weeks   Status On-going     SLP LONG TERM GOAL #3   Title pt will demo AB 80% of the time in 10 minutes mod complex conversation with modified indpendence   Time 5   Period Weeks   Status On-going         SLP Short Term Goals - 09/05/16 1804      SLP SHORT TERM GOAL #1   Title pt will perform dysphagia HEP wiht rare min A over two sessions   Baseline 08-15-16,    Time 1   Period Weeks   Status On-going     SLP SHORT TERM GOAL #2   Title pt will follow swallow strategies with rare min A   Time 1   Period Weeks   Status On-going     SLP SHORT TERM GOAL #3   Title pt will name 3 vocal hygiene strategies   Time 1   Period Weeks   Status On-going     [name], [name], [address] Phone: [phone]   Fax:  [phone]  Patient Details  Name: Eric Martinez MRN: [id number] Date of Birth: 11-25-1960 Referring Provider:  No ref. provider found  Encounter Date: 11/30/2016  [name], [name],  [name] 11/30/2016, 10:02 AM  [facility] [address] [employer], [name], [address] Phone: [phone]   Fax:  [phone]

## 2016-12-13 MED FILL — METHOTREXATE 25 MG/ML VIAL: 50 | 21 days supply | Qty: 4 | Fill #8

## 2016-12-28 DIAGNOSIS — C01 Malignant neoplasm of base of tongue: Secondary | ICD-10-CM | POA: Diagnosis not present

## 2016-12-28 DIAGNOSIS — E039 Hypothyroidism, unspecified: Secondary | ICD-10-CM | POA: Diagnosis not present

## 2017-01-08 MED FILL — METHOTREXATE 25 MG/ML VIAL: 50 | 21 days supply | Qty: 4 | Fill #9

## 2017-01-29 MED FILL — METHOTREXATE 25 MG/ML VIAL: 50 | 21 days supply | Qty: 4 | Fill #10

## 2017-01-30 DIAGNOSIS — Z08 Encounter for follow-up examination after completed treatment for malignant neoplasm: Secondary | ICD-10-CM | POA: Diagnosis not present

## 2017-01-30 DIAGNOSIS — I6523 Occlusion and stenosis of bilateral carotid arteries: Secondary | ICD-10-CM | POA: Diagnosis not present

## 2017-01-30 DIAGNOSIS — Z85818 Personal history of malignant neoplasm of other sites of lip, oral cavity, and pharynx: Secondary | ICD-10-CM | POA: Diagnosis not present

## 2017-01-30 DIAGNOSIS — R490 Dysphonia: Secondary | ICD-10-CM | POA: Diagnosis not present

## 2017-01-30 DIAGNOSIS — C770 Secondary and unspecified malignant neoplasm of lymph nodes of head, face and neck: Secondary | ICD-10-CM | POA: Diagnosis not present

## 2017-01-30 DIAGNOSIS — Z9221 Personal history of antineoplastic chemotherapy: Secondary | ICD-10-CM | POA: Diagnosis not present

## 2017-01-30 DIAGNOSIS — Z23 Encounter for immunization: Secondary | ICD-10-CM | POA: Diagnosis not present

## 2017-01-30 DIAGNOSIS — Z923 Personal history of irradiation: Secondary | ICD-10-CM | POA: Diagnosis not present

## 2017-01-30 DIAGNOSIS — R432 Parageusia: Secondary | ICD-10-CM | POA: Diagnosis not present

## 2017-01-30 DIAGNOSIS — K117 Disturbances of salivary secretion: Secondary | ICD-10-CM | POA: Diagnosis not present

## 2017-02-21 MED FILL — METHOTREXATE 25 MG/ML VIAL: 50 | 21 days supply | Qty: 4 | Fill #11

## 2017-03-19 MED FILL — METHOTREXATE 25 MG/ML VIAL: 50 | 21 days supply | Qty: 4 | Fill #12

## 2017-04-17 DIAGNOSIS — Z79899 Other long term (current) drug therapy: Secondary | ICD-10-CM | POA: Diagnosis not present

## 2017-04-17 DIAGNOSIS — M3390 Dermatopolymyositis, unspecified, organ involvement unspecified: Secondary | ICD-10-CM | POA: Diagnosis not present

## 2017-04-17 DIAGNOSIS — C01 Malignant neoplasm of base of tongue: Secondary | ICD-10-CM | POA: Diagnosis not present

## 2017-04-17 DIAGNOSIS — R1312 Dysphagia, oropharyngeal phase: Secondary | ICD-10-CM | POA: Diagnosis not present

## 2017-04-17 MED FILL — METHOTREXATE 25 MG/ML VIAL: 50 | 28 days supply | Qty: 4 | Fill #0

## 2017-04-17 MED FILL — predniSONE 5 MG TABS: 5 | 30 days supply | Qty: 30 | Fill #0

## 2017-04-19 MED FILL — predniSONE 5 MG TABS: 5 | 30 days supply | Qty: 150 | Fill #1

## 2017-05-22 MED FILL — METHOTREXATE 25 MG/ML VIAL: 50 | 21 days supply | Qty: 4 | Fill #13

## 2017-06-05 DIAGNOSIS — Z85819 Personal history of malignant neoplasm of unspecified site of lip, oral cavity, and pharynx: Secondary | ICD-10-CM | POA: Diagnosis not present

## 2017-06-05 DIAGNOSIS — Z9221 Personal history of antineoplastic chemotherapy: Secondary | ICD-10-CM | POA: Diagnosis not present

## 2017-06-05 DIAGNOSIS — C770 Secondary and unspecified malignant neoplasm of lymph nodes of head, face and neck: Secondary | ICD-10-CM | POA: Diagnosis not present

## 2017-06-05 DIAGNOSIS — Z08 Encounter for follow-up examination after completed treatment for malignant neoplasm: Secondary | ICD-10-CM | POA: Diagnosis not present

## 2017-06-05 DIAGNOSIS — M3313 Other dermatomyositis without myopathy: Secondary | ICD-10-CM | POA: Diagnosis not present

## 2017-06-05 DIAGNOSIS — Z923 Personal history of irradiation: Secondary | ICD-10-CM | POA: Diagnosis not present

## 2017-06-26 MED FILL — METHOTREXATE 25 MG/ML VIAL: 50 | 28 days supply | Qty: 4 | Fill #1

## 2017-07-02 DIAGNOSIS — E039 Hypothyroidism, unspecified: Secondary | ICD-10-CM | POA: Diagnosis not present

## 2017-07-02 DIAGNOSIS — C01 Malignant neoplasm of base of tongue: Secondary | ICD-10-CM | POA: Diagnosis not present

## 2017-07-19 MED FILL — METHOTREXATE 25 MG/ML VIAL: 50 | 28 days supply | Qty: 4 | Fill #2

## 2017-08-13 MED FILL — METHOTREXATE 25 MG/ML VIAL: 50 | 28 days supply | Qty: 4 | Fill #3

## 2017-08-14 DIAGNOSIS — M3390 Dermatopolymyositis, unspecified, organ involvement unspecified: Secondary | ICD-10-CM | POA: Diagnosis not present

## 2017-08-14 DIAGNOSIS — Z79899 Other long term (current) drug therapy: Secondary | ICD-10-CM | POA: Diagnosis not present

## 2017-08-14 DIAGNOSIS — Z85819 Personal history of malignant neoplasm of unspecified site of lip, oral cavity, and pharynx: Secondary | ICD-10-CM | POA: Diagnosis not present

## 2017-08-14 MED FILL — predniSONE 1 MG TABS: 1 | 30 days supply | Qty: 120 | Fill #0

## 2017-09-10 MED FILL — METHOTREXATE 25 MG/ML VIAL: 50 | 28 days supply | Qty: 4 | Fill #4

## 2017-09-10 MED FILL — predniSONE 1 MG TABS: 1 | 30 days supply | Qty: 120 | Fill #1

## 2017-09-12 DIAGNOSIS — E7800 Pure hypercholesterolemia, unspecified: Secondary | ICD-10-CM | POA: Diagnosis not present

## 2017-09-12 DIAGNOSIS — E559 Vitamin D deficiency, unspecified: Secondary | ICD-10-CM | POA: Diagnosis not present

## 2017-09-12 DIAGNOSIS — Z0000 Encounter for general adult medical examination without abnormal findings: Secondary | ICD-10-CM | POA: Diagnosis not present

## 2017-09-12 DIAGNOSIS — Z125 Encounter for screening for malignant neoplasm of prostate: Secondary | ICD-10-CM | POA: Diagnosis not present

## 2017-09-21 DIAGNOSIS — L255 Unspecified contact dermatitis due to plants, except food: Secondary | ICD-10-CM | POA: Diagnosis not present

## 2017-10-08 MED FILL — METHOTREXATE 25 MG/ML VIAL: 50 | 28 days supply | Qty: 4 | Fill #5

## 2017-10-24 MED FILL — predniSONE 1 MG TABS: 1 | 30 days supply | Qty: 120 | Fill #2

## 2017-11-12 MED FILL — METHOTREXATE 25 MG/ML VIAL: 50 | 28 days supply | Qty: 4 | Fill #6

## 2017-11-26 MED FILL — predniSONE 1 MG TABS: 1 | 30 days supply | Qty: 120 | Fill #3

## 2017-12-11 MED FILL — METHOTREXATE 25 MG/ML VIAL: 50 | 28 days supply | Qty: 4 | Fill #7

## 2018-01-07 DIAGNOSIS — M3390 Dermatopolymyositis, unspecified, organ involvement unspecified: Secondary | ICD-10-CM | POA: Diagnosis not present

## 2018-01-07 DIAGNOSIS — Z79899 Other long term (current) drug therapy: Secondary | ICD-10-CM | POA: Diagnosis not present

## 2018-01-07 DIAGNOSIS — Z923 Personal history of irradiation: Secondary | ICD-10-CM | POA: Diagnosis not present

## 2018-01-07 MED FILL — METHOTREXATE 25 MG/ML VIAL: 50 | 28 days supply | Qty: 4 | Fill #8

## 2018-01-07 MED FILL — predniSONE 1 MG TABS: 1 | 30 days supply | Qty: 90 | Fill #0

## 2018-01-29 DIAGNOSIS — R749 Abnormal serum enzyme level, unspecified: Secondary | ICD-10-CM | POA: Diagnosis not present

## 2018-01-31 DIAGNOSIS — J309 Allergic rhinitis, unspecified: Secondary | ICD-10-CM | POA: Diagnosis not present

## 2018-01-31 DIAGNOSIS — I10 Essential (primary) hypertension: Secondary | ICD-10-CM | POA: Diagnosis not present

## 2018-01-31 MED FILL — AMLODIPINE 2.5 MG TABLET: 2.5 | 30 days supply | Qty: 30 | Fill #0

## 2018-02-07 MED FILL — METHOTREXATE 25 MG/ML VIAL: 50 | 28 days supply | Qty: 4 | Fill #9

## 2018-02-19 DIAGNOSIS — H40013 Open angle with borderline findings, low risk, bilateral: Secondary | ICD-10-CM | POA: Diagnosis not present

## 2018-03-05 MED FILL — METHOTREXATE 25 MG/ML VIAL: 50 | 28 days supply | Qty: 4 | Fill #10

## 2018-03-07 MED FILL — predniSONE 5 MG TABS: 5 | 30 days supply | Qty: 30 | Fill #0

## 2018-04-02 MED FILL — METHOTREXATE 25 MG/ML VIAL: 50 | 28 days supply | Qty: 4 | Fill #11

## 2018-04-02 MED FILL — predniSONE 5 MG TABS: 5 | 30 days supply | Qty: 30 | Fill #1

## 2018-04-12 DIAGNOSIS — M3390 Dermatopolymyositis, unspecified, organ involvement unspecified: Secondary | ICD-10-CM | POA: Diagnosis not present

## 2018-04-12 DIAGNOSIS — R21 Rash and other nonspecific skin eruption: Secondary | ICD-10-CM | POA: Diagnosis not present

## 2018-04-12 DIAGNOSIS — Z79899 Other long term (current) drug therapy: Secondary | ICD-10-CM | POA: Diagnosis not present

## 2018-04-12 MED FILL — predniSONE 1 MG TABS: 1 | 30 days supply | Qty: 90 | Fill #0

## 2018-04-12 MED FILL — HYDROXYCHLOROQUINE SULFATE: 200 | 30 days supply | Qty: 60 | Fill #0

## 2018-04-23 DIAGNOSIS — Z7952 Long term (current) use of systemic steroids: Secondary | ICD-10-CM | POA: Diagnosis not present

## 2018-04-23 DIAGNOSIS — C770 Secondary and unspecified malignant neoplasm of lymph nodes of head, face and neck: Secondary | ICD-10-CM | POA: Diagnosis not present

## 2018-04-23 DIAGNOSIS — K117 Disturbances of salivary secretion: Secondary | ICD-10-CM | POA: Diagnosis not present

## 2018-04-23 DIAGNOSIS — M3313 Other dermatomyositis without myopathy: Secondary | ICD-10-CM | POA: Diagnosis not present

## 2018-04-23 DIAGNOSIS — Z85818 Personal history of malignant neoplasm of other sites of lip, oral cavity, and pharynx: Secondary | ICD-10-CM | POA: Diagnosis not present

## 2018-04-23 DIAGNOSIS — Z08 Encounter for follow-up examination after completed treatment for malignant neoplasm: Secondary | ICD-10-CM | POA: Diagnosis not present

## 2018-04-23 DIAGNOSIS — Z923 Personal history of irradiation: Secondary | ICD-10-CM | POA: Diagnosis not present

## 2018-04-30 MED FILL — METHOTREXATE 25 MG/ML VIAL: 50 | 28 days supply | Qty: 4 | Fill #0

## 2018-05-16 DIAGNOSIS — I10 Essential (primary) hypertension: Secondary | ICD-10-CM | POA: Diagnosis not present

## 2018-05-16 MED FILL — AMLODIPINE 2.5 MG TABLET: 2.5 | 30 days supply | Qty: 30 | Fill #0

## 2018-05-16 MED FILL — HYDROXYCHLOROQUINE SULFATE: 200 | 30 days supply | Qty: 60 | Fill #1

## 2018-05-16 MED FILL — predniSONE 1 MG TABS: 1 | 30 days supply | Qty: 90 | Fill #1

## 2018-05-28 MED FILL — METHOTREXATE 25 MG/ML VIAL: 50 | 28 days supply | Qty: 4 | Fill #1

## 2018-05-30 MED FILL — HYDROXYCHLOROQUINE SULFATE: 200 | 30 days supply | Qty: 60 | Fill #2 | Status: TO

## 2018-05-30 MED FILL — AMLODIPINE 2.5 MG TABLET: 2.5 | 30 days supply | Qty: 30 | Fill #1 | Status: TO

## 2018-06-04 MED FILL — TRIAMCINOLONE 0.5% CREAM: 0.5 | 10 days supply | Qty: 30 | Fill #0

## 2018-06-07 MED FILL — METHOTREXATE 25 MG/ML VIAL: 50 | 28 days supply | Qty: 4 | Fill #2 | Status: TO

## 2018-06-13 MED FILL — predniSONE 5 MG TABS: 5 | 30 days supply | Qty: 30 | Fill #0

## 2018-06-20 MED FILL — HYDROXYCHLOROQUINE 200 MG: 200 | 30 days supply | Qty: 60 | Fill #0

## 2018-07-11 DIAGNOSIS — Z85819 Personal history of malignant neoplasm of unspecified site of lip, oral cavity, and pharynx: Secondary | ICD-10-CM | POA: Diagnosis not present

## 2018-07-11 DIAGNOSIS — Z79899 Other long term (current) drug therapy: Secondary | ICD-10-CM | POA: Diagnosis not present

## 2018-07-11 DIAGNOSIS — R21 Rash and other nonspecific skin eruption: Secondary | ICD-10-CM | POA: Diagnosis not present

## 2018-07-11 DIAGNOSIS — M3390 Dermatopolymyositis, unspecified, organ involvement unspecified: Secondary | ICD-10-CM | POA: Diagnosis not present

## 2018-07-12 MED FILL — predniSONE 1 MG TABS: 1 | 30 days supply | Qty: 120 | Fill #0

## 2018-07-15 MED FILL — AMLODIPINE 2.5 MG TABLET: 2.5 | 30 days supply | Qty: 30 | Fill #0

## 2018-07-16 MED FILL — HYDROXYCHLOROQUINE SULFATE: 200 | 30 days supply | Qty: 60 | Fill #1

## 2018-07-23 MED FILL — METHOTREXATE 25 MG/ML VIAL: 50 | 28 days supply | Qty: 4 | Fill #0 | Status: TO

## 2018-08-13 MED FILL — HYDROXYCHLOROQUINE SULFATE: 200 | 30 days supply | Qty: 60 | Fill #2

## 2018-08-14 MED FILL — AMLODIPINE 2.5 MG TABLET: 2.5 | 30 days supply | Qty: 30 | Fill #1

## 2018-08-26 MED FILL — METHOTREXATE 25 MG/ML VIAL: 50 | 28 days supply | Qty: 4 | Fill #0

## 2018-09-04 DIAGNOSIS — E559 Vitamin D deficiency, unspecified: Secondary | ICD-10-CM | POA: Diagnosis not present

## 2018-09-04 DIAGNOSIS — Z0000 Encounter for general adult medical examination without abnormal findings: Secondary | ICD-10-CM | POA: Diagnosis not present

## 2018-09-04 DIAGNOSIS — I10 Essential (primary) hypertension: Secondary | ICD-10-CM | POA: Diagnosis not present

## 2018-09-04 DIAGNOSIS — Z125 Encounter for screening for malignant neoplasm of prostate: Secondary | ICD-10-CM | POA: Diagnosis not present

## 2018-09-04 DIAGNOSIS — E7800 Pure hypercholesterolemia, unspecified: Secondary | ICD-10-CM | POA: Diagnosis not present

## 2018-09-09 DIAGNOSIS — L570 Actinic keratosis: Secondary | ICD-10-CM | POA: Diagnosis not present

## 2018-09-09 DIAGNOSIS — L821 Other seborrheic keratosis: Secondary | ICD-10-CM | POA: Diagnosis not present

## 2018-09-09 DIAGNOSIS — D1801 Hemangioma of skin and subcutaneous tissue: Secondary | ICD-10-CM | POA: Diagnosis not present

## 2018-09-09 DIAGNOSIS — D225 Melanocytic nevi of trunk: Secondary | ICD-10-CM | POA: Diagnosis not present

## 2018-09-09 DIAGNOSIS — L814 Other melanin hyperpigmentation: Secondary | ICD-10-CM | POA: Diagnosis not present

## 2018-09-10 MED FILL — MOMETASONE FUROATE 0.1% CRM: 0.1 | 28 days supply | Qty: 45 | Fill #0

## 2018-09-13 MED FILL — HYDROXYCHLOROQUINE SULFATE: 200 | 30 days supply | Qty: 60 | Fill #3

## 2018-09-18 MED FILL — AMLODIPINE 2.5 MG TABLET: 2.5 | 30 days supply | Qty: 30 | Fill #0

## 2018-09-18 MED FILL — predniSONE 1 MG TABS: 1 | 30 days supply | Qty: 120 | Fill #0

## 2018-09-19 MED FILL — METHOTREXATE 25 MG/ML VIAL: 50 | 28 days supply | Qty: 4 | Fill #1

## 2018-10-02 DIAGNOSIS — Z0000 Encounter for general adult medical examination without abnormal findings: Secondary | ICD-10-CM | POA: Diagnosis not present

## 2018-10-02 DIAGNOSIS — E875 Hyperkalemia: Secondary | ICD-10-CM | POA: Diagnosis not present

## 2018-10-02 DIAGNOSIS — K620 Anal polyp: Secondary | ICD-10-CM | POA: Diagnosis not present

## 2018-10-02 DIAGNOSIS — E785 Hyperlipidemia, unspecified: Secondary | ICD-10-CM | POA: Diagnosis not present

## 2018-10-02 DIAGNOSIS — I6523 Occlusion and stenosis of bilateral carotid arteries: Secondary | ICD-10-CM | POA: Diagnosis not present

## 2018-10-02 DIAGNOSIS — Z1212 Encounter for screening for malignant neoplasm of rectum: Secondary | ICD-10-CM | POA: Diagnosis not present

## 2018-10-02 DIAGNOSIS — E559 Vitamin D deficiency, unspecified: Secondary | ICD-10-CM | POA: Diagnosis not present

## 2018-10-02 DIAGNOSIS — Z1159 Encounter for screening for other viral diseases: Secondary | ICD-10-CM | POA: Diagnosis not present

## 2018-10-12 MED FILL — HYDROXYCHLOROQUINE 200 MG T: 200 | 30 days supply | Qty: 60 | Fill #4

## 2018-10-15 MED FILL — METHOTREXATE 25 MG/ML VIAL: 50 | 28 days supply | Qty: 4 | Fill #2

## 2018-10-15 MED FILL — AMLODIPINE 2.5 MG TABLET: 2.5 | 30 days supply | Qty: 30 | Fill #1

## 2018-11-01 DIAGNOSIS — M3390 Dermatopolymyositis, unspecified, organ involvement unspecified: Secondary | ICD-10-CM | POA: Diagnosis not present

## 2018-11-01 DIAGNOSIS — Z79899 Other long term (current) drug therapy: Secondary | ICD-10-CM | POA: Diagnosis not present

## 2018-11-11 MED FILL — HYDROXYCHLOROQUINE 200 MG T: 200 | 30 days supply | Qty: 60 | Fill #5

## 2018-11-13 MED FILL — predniSONE 1 MG TABS: 1 | 30 days supply | Qty: 120 | Fill #1

## 2018-11-14 MED FILL — AMLODIPINE 2.5 MG TABLET: 2.5 | 30 days supply | Qty: 30 | Fill #0

## 2018-11-14 MED FILL — METHOTREXATE 25 MG/ML VIAL: 50 | 28 days supply | Qty: 4 | Fill #3

## 2018-11-22 MED FILL — HYDROXYCHLOROQUINE 200 MG T: 200 | 30 days supply | Qty: 60 | Fill #5

## 2018-11-27 DIAGNOSIS — Z1211 Encounter for screening for malignant neoplasm of colon: Secondary | ICD-10-CM | POA: Diagnosis not present

## 2018-11-27 DIAGNOSIS — K640 First degree hemorrhoids: Secondary | ICD-10-CM | POA: Diagnosis not present

## 2018-11-27 DIAGNOSIS — M3390 Dermatopolymyositis, unspecified, organ involvement unspecified: Secondary | ICD-10-CM | POA: Diagnosis not present

## 2018-12-23 MED FILL — METHOTREXATE 25 MG/ML VIAL: 50 | 28 days supply | Qty: 4 | Fill #4

## 2018-12-23 MED FILL — HYDROXYCHLOROQUINE SULFATE: 200 | 30 days supply | Qty: 60 | Fill #0

## 2018-12-23 MED FILL — predniSONE 1 MG TABS: 1 | 30 days supply | Qty: 120 | Fill #2

## 2019-01-07 DIAGNOSIS — M25552 Pain in left hip: Secondary | ICD-10-CM | POA: Diagnosis not present

## 2019-01-10 DIAGNOSIS — Z23 Encounter for immunization: Secondary | ICD-10-CM | POA: Diagnosis not present

## 2019-01-14 DIAGNOSIS — M25552 Pain in left hip: Secondary | ICD-10-CM | POA: Diagnosis not present

## 2019-01-21 DIAGNOSIS — M25552 Pain in left hip: Secondary | ICD-10-CM | POA: Diagnosis not present

## 2019-01-27 MED FILL — HYDROXYCHLOROQUINE SULFATE: 200 | 30 days supply | Qty: 60 | Fill #1

## 2019-01-27 MED FILL — predniSONE 1 MG TABS: 1 | 30 days supply | Qty: 120 | Fill #3

## 2019-01-27 MED FILL — METHOTREXATE 25 MG/ML VIAL: 50 | 28 days supply | Qty: 4 | Fill #5

## 2019-01-29 DIAGNOSIS — M25552 Pain in left hip: Secondary | ICD-10-CM | POA: Diagnosis not present

## 2019-02-05 DIAGNOSIS — M25552 Pain in left hip: Secondary | ICD-10-CM | POA: Diagnosis not present

## 2019-02-11 DIAGNOSIS — M79605 Pain in left leg: Secondary | ICD-10-CM | POA: Diagnosis not present

## 2019-02-11 MED FILL — GABAPENTIN 300 MG CAPSULE: 300 | 30 days supply | Qty: 30 | Fill #0

## 2019-02-18 DIAGNOSIS — M9903 Segmental and somatic dysfunction of lumbar region: Secondary | ICD-10-CM | POA: Diagnosis not present

## 2019-02-18 DIAGNOSIS — M62830 Muscle spasm of back: Secondary | ICD-10-CM | POA: Diagnosis not present

## 2019-02-20 DIAGNOSIS — M9903 Segmental and somatic dysfunction of lumbar region: Secondary | ICD-10-CM | POA: Diagnosis not present

## 2019-02-20 DIAGNOSIS — M62830 Muscle spasm of back: Secondary | ICD-10-CM | POA: Diagnosis not present

## 2019-02-24 DIAGNOSIS — M9903 Segmental and somatic dysfunction of lumbar region: Secondary | ICD-10-CM | POA: Diagnosis not present

## 2019-02-24 DIAGNOSIS — M62830 Muscle spasm of back: Secondary | ICD-10-CM | POA: Diagnosis not present

## 2019-02-26 MED FILL — HYDROXYCHLOROQUINE SULFATE: 200 | 30 days supply | Qty: 60 | Fill #2

## 2019-02-26 MED FILL — METHOTREXATE 25 MG/ML VIAL: 50 | 28 days supply | Qty: 4 | Fill #6

## 2019-02-27 DIAGNOSIS — M9903 Segmental and somatic dysfunction of lumbar region: Secondary | ICD-10-CM | POA: Diagnosis not present

## 2019-02-27 DIAGNOSIS — M62830 Muscle spasm of back: Secondary | ICD-10-CM | POA: Diagnosis not present

## 2019-02-28 DIAGNOSIS — M3390 Dermatopolymyositis, unspecified, organ involvement unspecified: Secondary | ICD-10-CM | POA: Diagnosis not present

## 2019-02-28 DIAGNOSIS — Z79899 Other long term (current) drug therapy: Secondary | ICD-10-CM | POA: Diagnosis not present

## 2019-03-03 DIAGNOSIS — M62830 Muscle spasm of back: Secondary | ICD-10-CM | POA: Diagnosis not present

## 2019-03-03 DIAGNOSIS — M9903 Segmental and somatic dysfunction of lumbar region: Secondary | ICD-10-CM | POA: Diagnosis not present

## 2019-03-06 DIAGNOSIS — M9903 Segmental and somatic dysfunction of lumbar region: Secondary | ICD-10-CM | POA: Diagnosis not present

## 2019-03-06 DIAGNOSIS — M62830 Muscle spasm of back: Secondary | ICD-10-CM | POA: Diagnosis not present

## 2019-03-10 DIAGNOSIS — M9903 Segmental and somatic dysfunction of lumbar region: Secondary | ICD-10-CM | POA: Diagnosis not present

## 2019-03-10 DIAGNOSIS — M62830 Muscle spasm of back: Secondary | ICD-10-CM | POA: Diagnosis not present

## 2019-03-19 ENCOUNTER — Ambulatory Visit: Payer: 59 | Attending: Internal Medicine

## 2019-03-19 DIAGNOSIS — Z20822 Contact with and (suspected) exposure to covid-19: Secondary | ICD-10-CM

## 2019-03-19 MED FILL — predniSONE 1 MG TABS: 1 | 30 days supply | Qty: 120 | Fill #4

## 2019-03-20 DIAGNOSIS — M9903 Segmental and somatic dysfunction of lumbar region: Secondary | ICD-10-CM | POA: Diagnosis not present

## 2019-03-20 DIAGNOSIS — M62830 Muscle spasm of back: Secondary | ICD-10-CM | POA: Diagnosis not present

## 2019-03-20 LAB — NOVEL CORONAVIRUS, NAA: SARS-CoV-2, NAA: NOT DETECTED

## 2019-03-24 DIAGNOSIS — M9903 Segmental and somatic dysfunction of lumbar region: Secondary | ICD-10-CM | POA: Diagnosis not present

## 2019-03-24 DIAGNOSIS — M62830 Muscle spasm of back: Secondary | ICD-10-CM | POA: Diagnosis not present

## 2019-03-26 ENCOUNTER — Ambulatory Visit: Payer: 59 | Admitting: Family Medicine

## 2019-03-27 ENCOUNTER — Other Ambulatory Visit: Payer: Self-pay

## 2019-03-27 ENCOUNTER — Ambulatory Visit (INDEPENDENT_AMBULATORY_CARE_PROVIDER_SITE_OTHER): Payer: 59 | Admitting: Family Medicine

## 2019-03-27 ENCOUNTER — Encounter: Payer: Self-pay | Admitting: Family Medicine

## 2019-03-27 DIAGNOSIS — M25552 Pain in left hip: Secondary | ICD-10-CM | POA: Diagnosis not present

## 2019-03-27 MED FILL — HYDROXYCHLOROQUINE SULFATE: 200 | 30 days supply | Qty: 60 | Fill #3

## 2019-03-27 MED FILL — METHOTREXATE 25 MG/ML VIAL: 50 | 28 days supply | Qty: 4 | Fill #7

## 2019-03-27 NOTE — Progress Notes (Signed)
AP lumbar spine x-ray from chiropractor is now in media section.  No definite AVN, but will obtain two-view dedicated hip x-rays prior to injection if pain recurs.

## 2019-03-27 NOTE — Progress Notes (Signed)
Office Visit Note   Patient: Eric Martinez           Date of Birth: Nov 19, 1960           MRN: [id number] Visit Date: 03/27/2019 Requested by: Tommy Medal, [place], [place],  [place] [address] PCP: Tommy Medal, MD  Subjective: Chief Complaint  Patient presents with  . Left Hip - Pain    Pain lateral hip and down the leg, since 12/2018. NKI. Has been to a Restaurant manager, fast food. Hurts all the time. Hard to sleep sometimes.    HPI: He is here at the request of Dr. Owens Shark for left hip pain.  Intermittent pain since October.  No injury, he just started noticing pain mostly on the lateral aspect of his hip.  The right 1 bothers him occasionally, but the left one more often.  Initially he went to [facility] for physical therapy.  He was improving but not completely.  He went to [facility] for assurance that the diagnosis was correct.  He did not have any x-rays at that point but there were no changes made to his treatment regimen.  Most recently he has been seeing Dr. Owens Shark for chiropractic adjustments.  He is getting good relief but the pain still keeps coming back intermittently.  For some reason since Monday, he has been virtually pain-free and today he feels good.  He has a history of dermatomyositis.  It is currently in remission but he has been on high-dose steroids for long periods of time over the years.  He is currently on 3 mg daily for maintenance and also methotrexate and Plaquenil.  He had a bone density test about a year ago which was borderline per his report.               ROS: No fever or chills.  All other systems were reviewed and are negative.  Objective: Vital Signs: There were no vitals taken for this visit.  Physical Exam:  General:  Alert and oriented, in no acute distress. Pulm:  Breathing unlabored. Psy:  Normal mood, congruent affect. Skin: No rash. Hips: He has stiffness in the left hip with internal rotation limited to just past  neutral.  He does have some discomfort when I do this.  There is also slight tenderness over the greater trochanter trochanter but nothing severe today.  No pain with resisted strength testing of his lower extremities and strength is normal.  Imaging: None today.  He had some recently at Dr. Saul Fordyce office.  Assessment & Plan: 1.  Improved left greater than right hip pain, possibly greater trochanter syndrome but with long-term steroid use, cannot rule out AVN of the femoral head. -I will ask Dr. Owens Shark if he is able to send me the x-rays for evaluation of the hips.  If patient has another flareup, he will come back in and we will consider injection either into the greater trochanter or intra-articularly if indicated based on x-rays.     Procedures: No procedures performed  No notes on file     PMFS History: Patient Active Problem List   Diagnosis Date Noted  . Cancer of base of tongue ([place]) 08/09/2016  . Need for tetanus booster 06/20/2015  . Puncture wound of foot 06/20/2015  . Muscle weakness 12/17/2013   Past Medical History:  Diagnosis Date  . Hyperlipidemia   . Myositis     Family History  Problem Relation Age of Onset  . Pancreatic cancer  Father        Deceased  . Alzheimer's disease Mother        Deceased  . Hypertension Mother   . Healthy Sister   . Hypertension Sister   . Healthy Brother     Past Surgical History:  Procedure Laterality Date  . APPENDECTOMY    . MINOR MUSCLE BIOPSY Right 11/18/2013   Procedure: MINOR MUSCLE BIOPSY;  Surgeon: Edward Jolly, MD;  Location: [facility];  Service: General;  Laterality: Right;   Social History   Occupational History  . Not on file  Tobacco Use  . Smoking status: Former Smoker    Years: 5.00  . Smokeless tobacco: Never Used  . Tobacco comment: 2010  Substance and Sexual Activity  . Alcohol use: Yes    Comment: 3-5 beers/nightly, more on the weeekends for at least 20 years  . Drug use: No   . Sexual activity: Yes    Birth control/protection: Abstinence

## 2019-04-01 DIAGNOSIS — I6523 Occlusion and stenosis of bilateral carotid arteries: Secondary | ICD-10-CM | POA: Diagnosis not present

## 2019-04-01 DIAGNOSIS — E039 Hypothyroidism, unspecified: Secondary | ICD-10-CM | POA: Diagnosis not present

## 2019-04-01 DIAGNOSIS — M3390 Dermatopolymyositis, unspecified, organ involvement unspecified: Secondary | ICD-10-CM | POA: Diagnosis not present

## 2019-04-02 DIAGNOSIS — M62830 Muscle spasm of back: Secondary | ICD-10-CM | POA: Diagnosis not present

## 2019-04-02 DIAGNOSIS — M9903 Segmental and somatic dysfunction of lumbar region: Secondary | ICD-10-CM | POA: Diagnosis not present

## 2019-04-03 DIAGNOSIS — Z23 Encounter for immunization: Secondary | ICD-10-CM | POA: Diagnosis not present

## 2019-04-03 DIAGNOSIS — E039 Hypothyroidism, unspecified: Secondary | ICD-10-CM | POA: Diagnosis not present

## 2019-04-03 DIAGNOSIS — I6523 Occlusion and stenosis of bilateral carotid arteries: Secondary | ICD-10-CM | POA: Diagnosis not present

## 2019-04-09 DIAGNOSIS — M9903 Segmental and somatic dysfunction of lumbar region: Secondary | ICD-10-CM | POA: Diagnosis not present

## 2019-04-09 DIAGNOSIS — M62830 Muscle spasm of back: Secondary | ICD-10-CM | POA: Diagnosis not present

## 2019-04-17 DIAGNOSIS — M62830 Muscle spasm of back: Secondary | ICD-10-CM | POA: Diagnosis not present

## 2019-04-17 DIAGNOSIS — M9903 Segmental and somatic dysfunction of lumbar region: Secondary | ICD-10-CM | POA: Diagnosis not present

## 2019-04-22 DIAGNOSIS — C01 Malignant neoplasm of base of tongue: Secondary | ICD-10-CM | POA: Diagnosis not present

## 2019-04-22 DIAGNOSIS — Z9221 Personal history of antineoplastic chemotherapy: Secondary | ICD-10-CM | POA: Diagnosis not present

## 2019-04-22 DIAGNOSIS — C770 Secondary and unspecified malignant neoplasm of lymph nodes of head, face and neck: Secondary | ICD-10-CM | POA: Diagnosis not present

## 2019-04-22 DIAGNOSIS — Z08 Encounter for follow-up examination after completed treatment for malignant neoplasm: Secondary | ICD-10-CM | POA: Diagnosis not present

## 2019-04-22 DIAGNOSIS — Z923 Personal history of irradiation: Secondary | ICD-10-CM | POA: Diagnosis not present

## 2019-04-22 DIAGNOSIS — Z79899 Other long term (current) drug therapy: Secondary | ICD-10-CM | POA: Diagnosis not present

## 2019-04-22 DIAGNOSIS — Z85810 Personal history of malignant neoplasm of tongue: Secondary | ICD-10-CM | POA: Diagnosis not present

## 2019-04-30 ENCOUNTER — Other Ambulatory Visit (HOSPITAL_COMMUNITY): Payer: Self-pay | Admitting: Rheumatology

## 2019-04-30 MED FILL — predniSONE 1 MG TABS: 1 | 90 days supply | Qty: 270 | Fill #0

## 2019-04-30 MED FILL — HYDROXYCHLOROQUINE SULFATE: 200 | 30 days supply | Qty: 60 | Fill #0

## 2019-04-30 MED FILL — METHOTREXATE 25 MG/ML VIAL: 50 | 28 days supply | Qty: 4 | Fill #0

## 2019-05-05 DIAGNOSIS — M9903 Segmental and somatic dysfunction of lumbar region: Secondary | ICD-10-CM | POA: Diagnosis not present

## 2019-05-05 DIAGNOSIS — M62830 Muscle spasm of back: Secondary | ICD-10-CM | POA: Diagnosis not present

## 2019-05-23 DIAGNOSIS — Z23 Encounter for immunization: Secondary | ICD-10-CM | POA: Diagnosis not present

## 2019-05-27 DIAGNOSIS — M62830 Muscle spasm of back: Secondary | ICD-10-CM | POA: Diagnosis not present

## 2019-05-27 DIAGNOSIS — M9903 Segmental and somatic dysfunction of lumbar region: Secondary | ICD-10-CM | POA: Diagnosis not present

## 2019-06-03 ENCOUNTER — Other Ambulatory Visit (HOSPITAL_COMMUNITY): Payer: Self-pay | Admitting: Rheumatology

## 2019-06-03 MED FILL — METHOTREXATE 25 MG/ML VIAL: 50 | 28 days supply | Qty: 4 | Fill #0

## 2019-06-03 MED FILL — predniSONE 5 MG TABS: 5 | 90 days supply | Qty: 160 | Fill #0

## 2019-06-04 MED FILL — HYDROXYCHLOROQUINE SULFATE: 200 | 30 days supply | Qty: 60 | Fill #1

## 2019-06-12 DIAGNOSIS — M9903 Segmental and somatic dysfunction of lumbar region: Secondary | ICD-10-CM | POA: Diagnosis not present

## 2019-06-12 DIAGNOSIS — M62830 Muscle spasm of back: Secondary | ICD-10-CM | POA: Diagnosis not present

## 2019-06-27 MED FILL — HYDROXYCHLOROQUINE SULFATE: 200 | 30 days supply | Qty: 60 | Fill #2

## 2019-07-08 MED FILL — METHOTREXATE 25 MG/ML VIAL: 50 | 28 days supply | Qty: 4 | Fill #1

## 2019-07-24 MED FILL — predniSONE 5 MG TABS: 5 | 14 days supply | Qty: 42 | Fill #0

## 2019-08-04 DIAGNOSIS — M3390 Dermatopolymyositis, unspecified, organ involvement unspecified: Secondary | ICD-10-CM | POA: Diagnosis not present

## 2019-08-04 DIAGNOSIS — Z79899 Other long term (current) drug therapy: Secondary | ICD-10-CM | POA: Diagnosis not present

## 2019-08-05 MED FILL — HYDROXYCHLOROQUINE SULFATE: 200 | 30 days supply | Qty: 60 | Fill #3

## 2019-08-05 MED FILL — METHOTREXATE 25 MG/ML VIAL: 50 | 28 days supply | Qty: 4 | Fill #2

## 2019-08-07 DIAGNOSIS — M62830 Muscle spasm of back: Secondary | ICD-10-CM | POA: Diagnosis not present

## 2019-08-07 DIAGNOSIS — M9903 Segmental and somatic dysfunction of lumbar region: Secondary | ICD-10-CM | POA: Diagnosis not present

## 2019-08-14 MED FILL — METHOTREXATE SODIUM 2.5 MG: 2.5 | 30 days supply | Qty: 40 | Fill #0

## 2019-08-20 ENCOUNTER — Other Ambulatory Visit (HOSPITAL_COMMUNITY): Payer: Self-pay | Admitting: Rheumatology

## 2019-08-20 MED FILL — predniSONE 5 MG TABS: 5 | 90 days supply | Qty: 111 | Fill #0

## 2019-09-01 DIAGNOSIS — M9905 Segmental and somatic dysfunction of pelvic region: Secondary | ICD-10-CM | POA: Diagnosis not present

## 2019-09-01 DIAGNOSIS — M9904 Segmental and somatic dysfunction of sacral region: Secondary | ICD-10-CM | POA: Diagnosis not present

## 2019-09-01 DIAGNOSIS — M5126 Other intervertebral disc displacement, lumbar region: Secondary | ICD-10-CM | POA: Diagnosis not present

## 2019-09-01 DIAGNOSIS — M9903 Segmental and somatic dysfunction of lumbar region: Secondary | ICD-10-CM | POA: Diagnosis not present

## 2019-09-02 ENCOUNTER — Ambulatory Visit: Payer: BC Managed Care – PPO | Admitting: Sports Medicine

## 2019-09-04 DIAGNOSIS — M9903 Segmental and somatic dysfunction of lumbar region: Secondary | ICD-10-CM | POA: Diagnosis not present

## 2019-09-04 DIAGNOSIS — M5126 Other intervertebral disc displacement, lumbar region: Secondary | ICD-10-CM | POA: Diagnosis not present

## 2019-09-04 DIAGNOSIS — M9905 Segmental and somatic dysfunction of pelvic region: Secondary | ICD-10-CM | POA: Diagnosis not present

## 2019-09-04 DIAGNOSIS — M9904 Segmental and somatic dysfunction of sacral region: Secondary | ICD-10-CM | POA: Diagnosis not present

## 2019-09-05 MED FILL — METHOTREXATE 25 MG/ML VIAL: 50 | 28 days supply | Qty: 4 | Fill #3

## 2019-09-05 MED FILL — HYDROXYCHLOROQUINE SULFATE: 200 | 30 days supply | Qty: 60 | Fill #4

## 2019-09-08 DIAGNOSIS — M9904 Segmental and somatic dysfunction of sacral region: Secondary | ICD-10-CM | POA: Diagnosis not present

## 2019-09-08 DIAGNOSIS — M9905 Segmental and somatic dysfunction of pelvic region: Secondary | ICD-10-CM | POA: Diagnosis not present

## 2019-09-08 DIAGNOSIS — M9903 Segmental and somatic dysfunction of lumbar region: Secondary | ICD-10-CM | POA: Diagnosis not present

## 2019-09-08 DIAGNOSIS — M5126 Other intervertebral disc displacement, lumbar region: Secondary | ICD-10-CM | POA: Diagnosis not present

## 2019-09-09 ENCOUNTER — Ambulatory Visit: Payer: BC Managed Care – PPO | Admitting: Sports Medicine

## 2019-09-11 DIAGNOSIS — M9905 Segmental and somatic dysfunction of pelvic region: Secondary | ICD-10-CM | POA: Diagnosis not present

## 2019-09-11 DIAGNOSIS — M9903 Segmental and somatic dysfunction of lumbar region: Secondary | ICD-10-CM | POA: Diagnosis not present

## 2019-09-11 DIAGNOSIS — M5126 Other intervertebral disc displacement, lumbar region: Secondary | ICD-10-CM | POA: Diagnosis not present

## 2019-09-11 DIAGNOSIS — M9904 Segmental and somatic dysfunction of sacral region: Secondary | ICD-10-CM | POA: Diagnosis not present

## 2019-09-12 ENCOUNTER — Other Ambulatory Visit: Payer: Self-pay | Admitting: Chiropractic Medicine

## 2019-09-12 ENCOUNTER — Other Ambulatory Visit: Payer: Self-pay

## 2019-09-12 ENCOUNTER — Ambulatory Visit
Admission: RE | Admit: 2019-09-12 | Discharge: 2019-09-12 | Disposition: A | Discharge status: Home / Self Care | Payer: BC Managed Care – PPO | Source: Ambulatory Visit | Attending: Chiropractic Medicine | Admitting: Chiropractic Medicine

## 2019-09-12 DIAGNOSIS — M25552 Pain in left hip: Secondary | ICD-10-CM

## 2019-09-17 ENCOUNTER — Other Ambulatory Visit: Payer: Self-pay | Admitting: Internal Medicine

## 2019-09-17 DIAGNOSIS — R9389 Abnormal findings on diagnostic imaging of other specified body structures: Secondary | ICD-10-CM

## 2019-09-19 DIAGNOSIS — M25452 Effusion, left hip: Secondary | ICD-10-CM | POA: Diagnosis not present

## 2019-09-19 DIAGNOSIS — R9389 Abnormal findings on diagnostic imaging of other specified body structures: Secondary | ICD-10-CM | POA: Diagnosis not present

## 2019-09-24 DIAGNOSIS — M25552 Pain in left hip: Secondary | ICD-10-CM | POA: Diagnosis not present

## 2019-09-29 DIAGNOSIS — M25552 Pain in left hip: Secondary | ICD-10-CM | POA: Diagnosis not present

## 2019-09-29 DIAGNOSIS — M1612 Unilateral primary osteoarthritis, left hip: Secondary | ICD-10-CM | POA: Diagnosis not present

## 2019-10-06 MED FILL — HYDROXYCHLOROQUINE SULFATE: 200 | 30 days supply | Qty: 60 | Fill #5

## 2019-10-06 MED FILL — METHOTREXATE 25 MG/ML VIAL: 50 | 28 days supply | Qty: 4 | Fill #4

## 2019-10-08 DIAGNOSIS — M87052 Idiopathic aseptic necrosis of left femur: Secondary | ICD-10-CM | POA: Diagnosis not present

## 2019-10-08 DIAGNOSIS — M9903 Segmental and somatic dysfunction of lumbar region: Secondary | ICD-10-CM | POA: Diagnosis not present

## 2019-10-08 DIAGNOSIS — M5126 Other intervertebral disc displacement, lumbar region: Secondary | ICD-10-CM | POA: Diagnosis not present

## 2019-10-08 DIAGNOSIS — M87051 Idiopathic aseptic necrosis of right femur: Secondary | ICD-10-CM | POA: Diagnosis not present

## 2019-10-22 DIAGNOSIS — M87052 Idiopathic aseptic necrosis of left femur: Secondary | ICD-10-CM | POA: Diagnosis not present

## 2019-10-22 DIAGNOSIS — M5126 Other intervertebral disc displacement, lumbar region: Secondary | ICD-10-CM | POA: Diagnosis not present

## 2019-10-22 DIAGNOSIS — M87051 Idiopathic aseptic necrosis of right femur: Secondary | ICD-10-CM | POA: Diagnosis not present

## 2019-10-22 DIAGNOSIS — M9903 Segmental and somatic dysfunction of lumbar region: Secondary | ICD-10-CM | POA: Diagnosis not present

## 2019-10-28 DIAGNOSIS — M3313 Other dermatomyositis without myopathy: Secondary | ICD-10-CM | POA: Diagnosis not present

## 2019-10-28 DIAGNOSIS — Z923 Personal history of irradiation: Secondary | ICD-10-CM | POA: Diagnosis not present

## 2019-10-28 DIAGNOSIS — C01 Malignant neoplasm of base of tongue: Secondary | ICD-10-CM | POA: Diagnosis not present

## 2019-10-28 DIAGNOSIS — Z85810 Personal history of malignant neoplasm of tongue: Secondary | ICD-10-CM | POA: Diagnosis not present

## 2019-10-28 DIAGNOSIS — Z08 Encounter for follow-up examination after completed treatment for malignant neoplasm: Secondary | ICD-10-CM | POA: Diagnosis not present

## 2019-10-28 DIAGNOSIS — C770 Secondary and unspecified malignant neoplasm of lymph nodes of head, face and neck: Secondary | ICD-10-CM | POA: Diagnosis not present

## 2019-10-31 DIAGNOSIS — M87052 Idiopathic aseptic necrosis of left femur: Secondary | ICD-10-CM | POA: Diagnosis not present

## 2019-10-31 DIAGNOSIS — M5126 Other intervertebral disc displacement, lumbar region: Secondary | ICD-10-CM | POA: Diagnosis not present

## 2019-10-31 DIAGNOSIS — M87051 Idiopathic aseptic necrosis of right femur: Secondary | ICD-10-CM | POA: Diagnosis not present

## 2019-10-31 DIAGNOSIS — M9903 Segmental and somatic dysfunction of lumbar region: Secondary | ICD-10-CM | POA: Diagnosis not present

## 2019-11-05 MED FILL — HYDROXYCHLOROQUINE SULFATE: 200 | 30 days supply | Qty: 60 | Fill #6

## 2019-11-05 MED FILL — METHOTREXATE 25 MG/ML VIAL: 50 | 28 days supply | Qty: 4 | Fill #5

## 2019-11-07 MED FILL — predniSONE 5 MG TABS: 5 | 90 days supply | Qty: 90 | Fill #1

## 2019-11-11 DIAGNOSIS — M87052 Idiopathic aseptic necrosis of left femur: Secondary | ICD-10-CM | POA: Diagnosis not present

## 2019-11-11 DIAGNOSIS — M87051 Idiopathic aseptic necrosis of right femur: Secondary | ICD-10-CM | POA: Diagnosis not present

## 2019-11-11 DIAGNOSIS — Z79899 Other long term (current) drug therapy: Secondary | ICD-10-CM | POA: Diagnosis not present

## 2019-11-12 DIAGNOSIS — M5126 Other intervertebral disc displacement, lumbar region: Secondary | ICD-10-CM | POA: Diagnosis not present

## 2019-11-12 DIAGNOSIS — M87051 Idiopathic aseptic necrosis of right femur: Secondary | ICD-10-CM | POA: Diagnosis not present

## 2019-11-12 DIAGNOSIS — M9903 Segmental and somatic dysfunction of lumbar region: Secondary | ICD-10-CM | POA: Diagnosis not present

## 2019-11-12 DIAGNOSIS — M87052 Idiopathic aseptic necrosis of left femur: Secondary | ICD-10-CM | POA: Diagnosis not present

## 2019-11-19 DIAGNOSIS — M87052 Idiopathic aseptic necrosis of left femur: Secondary | ICD-10-CM | POA: Diagnosis not present

## 2019-11-19 DIAGNOSIS — M9903 Segmental and somatic dysfunction of lumbar region: Secondary | ICD-10-CM | POA: Diagnosis not present

## 2019-11-19 DIAGNOSIS — M87051 Idiopathic aseptic necrosis of right femur: Secondary | ICD-10-CM | POA: Diagnosis not present

## 2019-11-19 DIAGNOSIS — M5126 Other intervertebral disc displacement, lumbar region: Secondary | ICD-10-CM | POA: Diagnosis not present

## 2019-11-28 DIAGNOSIS — M5126 Other intervertebral disc displacement, lumbar region: Secondary | ICD-10-CM | POA: Diagnosis not present

## 2019-11-28 DIAGNOSIS — M87051 Idiopathic aseptic necrosis of right femur: Secondary | ICD-10-CM | POA: Diagnosis not present

## 2019-11-28 DIAGNOSIS — M87052 Idiopathic aseptic necrosis of left femur: Secondary | ICD-10-CM | POA: Diagnosis not present

## 2019-11-28 DIAGNOSIS — M9903 Segmental and somatic dysfunction of lumbar region: Secondary | ICD-10-CM | POA: Diagnosis not present

## 2019-12-02 MED FILL — METHOTREXATE 25 MG/ML VIAL: 50 | 28 days supply | Qty: 4 | Fill #6

## 2019-12-09 DIAGNOSIS — M5126 Other intervertebral disc displacement, lumbar region: Secondary | ICD-10-CM | POA: Diagnosis not present

## 2019-12-09 DIAGNOSIS — M87052 Idiopathic aseptic necrosis of left femur: Secondary | ICD-10-CM | POA: Diagnosis not present

## 2019-12-09 DIAGNOSIS — M87051 Idiopathic aseptic necrosis of right femur: Secondary | ICD-10-CM | POA: Diagnosis not present

## 2019-12-09 DIAGNOSIS — M9903 Segmental and somatic dysfunction of lumbar region: Secondary | ICD-10-CM | POA: Diagnosis not present

## 2019-12-11 DIAGNOSIS — M3390 Dermatopolymyositis, unspecified, organ involvement unspecified: Secondary | ICD-10-CM | POA: Diagnosis not present

## 2019-12-11 MED FILL — HYDROXYCHLOROQUINE SULFATE: 200 | 30 days supply | Qty: 60 | Fill #7

## 2019-12-12 DIAGNOSIS — M5126 Other intervertebral disc displacement, lumbar region: Secondary | ICD-10-CM | POA: Diagnosis not present

## 2019-12-12 DIAGNOSIS — M87051 Idiopathic aseptic necrosis of right femur: Secondary | ICD-10-CM | POA: Diagnosis not present

## 2019-12-12 DIAGNOSIS — M9903 Segmental and somatic dysfunction of lumbar region: Secondary | ICD-10-CM | POA: Diagnosis not present

## 2019-12-12 DIAGNOSIS — M87052 Idiopathic aseptic necrosis of left femur: Secondary | ICD-10-CM | POA: Diagnosis not present

## 2019-12-22 DIAGNOSIS — M3390 Dermatopolymyositis, unspecified, organ involvement unspecified: Secondary | ICD-10-CM | POA: Diagnosis not present

## 2019-12-22 DIAGNOSIS — M87059 Idiopathic aseptic necrosis of unspecified femur: Secondary | ICD-10-CM | POA: Diagnosis not present

## 2019-12-22 DIAGNOSIS — Z79899 Other long term (current) drug therapy: Secondary | ICD-10-CM | POA: Diagnosis not present

## 2019-12-30 MED FILL — METHOTREXATE 25 MG/ML VIAL: 50 | 28 days supply | Qty: 4 | Fill #7

## 2020-01-01 DIAGNOSIS — M9903 Segmental and somatic dysfunction of lumbar region: Secondary | ICD-10-CM | POA: Diagnosis not present

## 2020-01-01 DIAGNOSIS — M5126 Other intervertebral disc displacement, lumbar region: Secondary | ICD-10-CM | POA: Diagnosis not present

## 2020-01-01 DIAGNOSIS — M87052 Idiopathic aseptic necrosis of left femur: Secondary | ICD-10-CM | POA: Diagnosis not present

## 2020-01-01 DIAGNOSIS — M87051 Idiopathic aseptic necrosis of right femur: Secondary | ICD-10-CM | POA: Diagnosis not present

## 2020-01-09 DIAGNOSIS — M87051 Idiopathic aseptic necrosis of right femur: Secondary | ICD-10-CM | POA: Diagnosis not present

## 2020-01-09 DIAGNOSIS — M9903 Segmental and somatic dysfunction of lumbar region: Secondary | ICD-10-CM | POA: Diagnosis not present

## 2020-01-09 DIAGNOSIS — M87052 Idiopathic aseptic necrosis of left femur: Secondary | ICD-10-CM | POA: Diagnosis not present

## 2020-01-09 DIAGNOSIS — M5126 Other intervertebral disc displacement, lumbar region: Secondary | ICD-10-CM | POA: Diagnosis not present

## 2020-01-09 MED FILL — HYDROXYCHLOROQUINE SULFATE: 200 | 30 days supply | Qty: 60 | Fill #8

## 2020-01-21 DIAGNOSIS — Z23 Encounter for immunization: Secondary | ICD-10-CM | POA: Diagnosis not present

## 2020-01-27 ENCOUNTER — Other Ambulatory Visit (HOSPITAL_COMMUNITY): Payer: Self-pay | Admitting: Rheumatology

## 2020-01-27 MED FILL — METHOTREXATE 25 MG/ML VIAL: 50 | 28 days supply | Qty: 4 | Fill #8

## 2020-01-27 MED FILL — predniSONE 5 MG TABS: 5 | 28 days supply | Qty: 28 | Fill #2

## 2020-01-30 DIAGNOSIS — M87052 Idiopathic aseptic necrosis of left femur: Secondary | ICD-10-CM | POA: Diagnosis not present

## 2020-01-30 DIAGNOSIS — M5126 Other intervertebral disc displacement, lumbar region: Secondary | ICD-10-CM | POA: Diagnosis not present

## 2020-01-30 DIAGNOSIS — M87051 Idiopathic aseptic necrosis of right femur: Secondary | ICD-10-CM | POA: Diagnosis not present

## 2020-01-30 DIAGNOSIS — M9903 Segmental and somatic dysfunction of lumbar region: Secondary | ICD-10-CM | POA: Diagnosis not present

## 2020-02-18 MED FILL — HYDROXYCHLOROQUINE SULFATE: 200 | 30 days supply | Qty: 60 | Fill #9

## 2020-02-18 MED FILL — predniSONE 5 MG TABS: 5 | 90 days supply | Qty: 90 | Fill #0

## 2020-02-24 MED FILL — METHOTREXATE 25 MG/ML VIAL: 50 | 28 days supply | Qty: 4 | Fill #9

## 2020-03-03 DIAGNOSIS — E039 Hypothyroidism, unspecified: Secondary | ICD-10-CM | POA: Diagnosis not present

## 2020-03-05 DIAGNOSIS — E039 Hypothyroidism, unspecified: Secondary | ICD-10-CM | POA: Diagnosis not present

## 2020-03-08 DIAGNOSIS — E039 Hypothyroidism, unspecified: Secondary | ICD-10-CM | POA: Diagnosis not present

## 2020-03-10 DIAGNOSIS — M87052 Idiopathic aseptic necrosis of left femur: Secondary | ICD-10-CM | POA: Diagnosis not present

## 2020-03-10 DIAGNOSIS — M9903 Segmental and somatic dysfunction of lumbar region: Secondary | ICD-10-CM | POA: Diagnosis not present

## 2020-03-10 DIAGNOSIS — M87051 Idiopathic aseptic necrosis of right femur: Secondary | ICD-10-CM | POA: Diagnosis not present

## 2020-03-10 DIAGNOSIS — M5126 Other intervertebral disc displacement, lumbar region: Secondary | ICD-10-CM | POA: Diagnosis not present

## 2020-03-16 MED FILL — HYDROXYCHLOROQUINE SULFATE: 200 | 30 days supply | Qty: 60 | Fill #10

## 2020-03-23 MED FILL — METHOTREXATE 25 MG/ML VIAL: 50 | 28 days supply | Qty: 4 | Fill #10

## 2020-03-26 DIAGNOSIS — M1612 Unilateral primary osteoarthritis, left hip: Secondary | ICD-10-CM | POA: Diagnosis not present

## 2020-03-30 DIAGNOSIS — Z01818 Encounter for other preprocedural examination: Secondary | ICD-10-CM | POA: Diagnosis not present

## 2020-03-30 NOTE — Progress Notes (Signed)
COVID Vaccine Completed: Date COVID Vaccine completed: COVID vaccine manufacturer: Pfizer    Moderna   [employer]   PCP - Tommy Medal, MD Cardiologist -   Chest x-ray -  EKG -  Stress Test -  ECHO -  Cardiac Cath -  Pacemaker/ICD device last checked:  Sleep Study -  CPAP -   Fasting Blood Sugar -  Checks Blood Sugar _____ times a day  Blood Thinner Instructions: Aspirin Instructions: Last Dose:  Anesthesia review:   Patient denies shortness of breath, fever, cough and chest pain at PAT appointment   Patient verbalized understanding of instructions that were given to them at the PAT appointment. Patient was also instructed that they will need to review over the PAT instructions again at home before surgery.

## 2020-03-30 NOTE — Patient Instructions (Signed)
DUE TO COVID-19 ONLY ONE VISITOR IS ALLOWED TO COME WITH YOU AND STAY IN THE WAITING ROOM ONLY DURING PRE OP AND PROCEDURE DAY OF SURGERY. THE 1 VISITOR  MAY VISIT WITH YOU AFTER SURGERY IN YOUR PRIVATE ROOM DURING VISITING HOURS ONLY!  YOU NEED TO HAVE A COVID 19 TEST ON___1/14____ @_______ , THIS TEST MUST BE DONE BEFORE SURGERY,  COVID TESTING SITE [address] [place] [address], IT IS ON THE RIGHT GOING OUT [address] APPROXIMATELY  2 MINUTES PAST [employer] ON THE RIGHT. ONCE YOUR COVID TEST IS COMPLETED,  PLEASE BEGIN THE QUARANTINE INSTRUCTIONS AS OUTLINED IN YOUR HANDOUT.                Eric Martinez   Your procedure is scheduled on: 04/06/20   Report to [facility] Main  Entrance   Report to admitting at   8:00 AM     Call this number if you have problems the morning of surgery [phone]   . BRUSH YOUR TEETH MORNING OF SURGERY AND RINSE YOUR MOUTH OUT, NO CHEWING GUM CANDY OR MINTS.  No food after midnight.    You may have clear liquid until 7:30 AM.    At 7:00 AM drink pre surgery drink.   Nothing by mouth after 7:30 AM.   Take these medicines the morning of surgery with A SIP OF WATER: prednisone                                 You may not have any metal on your body including              piercings  Do not wear jewelry,  lotions, powders or deodorant                           Men may shave face and neck.   Do not bring valuables to the hospital. [place].  Contacts, dentures or bridgework may not be worn into surgery.      Patients discharged the day of surgery will not be allowed to drive home.   IF YOU ARE HAVING SURGERY AND GOING HOME THE SAME DAY, YOU MUST HAVE AN ADULT TO DRIVE YOU HOME AND BE WITH YOU FOR 24 HOURS  . YOU MAY GO HOME BY TAXI OR UBER OR ORTHERWISE, BUT AN ADULT MUST ACCOMPANY YOU HOME AND STAY WITH YOU FOR 24 HOURS.  Name and phone number of your  driver:  Special Instructions: N/A              Please read over the following fact sheets you were given: _____________________________________________________________________             [facility] - Preparing for Surgery Before surgery, you can play an important role.   Because skin is not sterile, your skin needs to be as free of germs as possible.   You can reduce the number of germs on your skin by washing with CHG (chlorahexidine gluconate) soap before surgery.   CHG is an antiseptic cleaner which kills germs and bonds with the skin to continue killing germs even after washing. Please DO NOT use if you have an allergy to CHG or antibacterial soaps.  If your skin becomes reddened/irritated stop using  the CHG and inform your nurse when you arrive at Short Stay. Do not shave (including legs and underarms) for at least 48 hours prior to the first CHG shower.  You may shave your face/neck. Please follow these instructions carefully:  1.  Shower with CHG Soap the night before surgery and the  morning of Surgery.  2.  If you choose to wash your hair, wash your hair first as usual with your  normal  shampoo.  3.  After you shampoo, rinse your hair and body thoroughly to remove the  shampoo.                                        4.  Use CHG as you would any other liquid soap.  You can apply chg directly  to the skin and wash                       Gently with a scrungie or clean washcloth.  5.  Apply the CHG Soap to your body ONLY FROM THE NECK DOWN.   Do not use on face/ open                           Wound or open sores. Avoid contact with eyes, ears mouth and genitals (private parts).                       Wash face,  Genitals (private parts) with your normal soap.             6.  Wash thoroughly, paying special attention to the area where your surgery  will be performed.  7.  Thoroughly rinse your body with warm water from the neck down.  8.  DO NOT shower/wash with your normal soap  after using and rinsing off  the CHG Soap.             9.  Pat yourself dry with a clean towel.            10.  Wear clean pajamas.            11.  Place clean sheets on your bed the night of your first shower and do not  sleep with pets. Day of Surgery : Do not apply any lotions/deodorants the morning of surgery.  Please wear clean clothes to the hospital/surgery center.  FAILURE TO FOLLOW THESE INSTRUCTIONS MAY RESULT IN THE CANCELLATION OF YOUR SURGERY PATIENT SIGNATURE_________________________________  NURSE SIGNATURE__________________________________  ________________________________________________________________________   Eric Martinez  An incentive spirometer is a tool that can help keep your lungs clear and active. This tool measures how well you are filling your lungs with each breath. Taking long deep breaths may help reverse or decrease the chance of developing breathing (pulmonary) problems (especially infection) following:  A long period of time when you are unable to move or be active. BEFORE THE PROCEDURE   If the spirometer includes an indicator to show your best effort, your nurse or respiratory therapist will set it to a desired goal.  If possible, sit up straight or lean slightly forward. Try not to slouch.  Hold the incentive spirometer in an upright position. INSTRUCTIONS FOR USE  1. Sit on the edge of your bed if possible, or sit up as far as you can in bed  or on a chair. 2. Hold the incentive spirometer in an upright position. 3. Breathe out normally. 4. Place the mouthpiece in your mouth and seal your lips tightly around it. 5. Breathe in slowly and as deeply as possible, raising the piston or the ball toward the top of the column. 6. Hold your breath for 3-5 seconds or for as long as possible. Allow the piston or ball to fall to the bottom of the column. 7. Remove the mouthpiece from your mouth and breathe out normally. 8. Rest for a few seconds  and repeat Steps 1 through 7 at least 10 times every 1-2 hours when you are awake. Take your time and take a few normal breaths between deep breaths. 9. The spirometer may include an indicator to show your best effort. Use the indicator as a goal to work toward during each repetition. 10. After each set of 10 deep breaths, practice coughing to be sure your lungs are clear. If you have an incision (the cut made at the time of surgery), support your incision when coughing by placing a pillow or rolled up towels firmly against it. Once you are able to get out of bed, walk around indoors and cough well. You may stop using the incentive spirometer when instructed by your caregiver.  RISKS AND COMPLICATIONS  Take your time so you do not get dizzy or light-headed.  If you are in pain, you may need to take or ask for pain medication before doing incentive spirometry. It is harder to take a deep breath if you are having pain. AFTER USE  Rest and breathe slowly and easily.  It can be helpful to keep track of a log of your progress. Your caregiver can provide you with a simple table to help with this. If you are using the spirometer at home, follow these instructions: [place] IF:   You are having difficultly using the spirometer.  You have trouble using the spirometer as often as instructed.  Your pain medication is not giving enough relief while using the spirometer.  You develop fever of 100.5 F (38.1 C) or higher. SEEK IMMEDIATE MEDICAL CARE IF:   You cough up bloody sputum that had not been present before.  You develop fever of 102 F (38.9 C) or greater.  You develop worsening pain at or near the incision site. MAKE SURE YOU:   Understand these instructions.  Will watch your condition.  Will get help right away if you are not doing well or get worse. Document Released: 07/17/2006 Document Revised: 05/29/2011 Document Reviewed: 09/17/2006 [facility] Patient Information  2014 [place], [place].   ________________________________________________________________________

## 2020-03-31 ENCOUNTER — Other Ambulatory Visit: Payer: Self-pay

## 2020-03-31 ENCOUNTER — Encounter (HOSPITAL_COMMUNITY): Payer: Self-pay

## 2020-03-31 ENCOUNTER — Encounter (HOSPITAL_COMMUNITY)
Admission: RE | Admit: 2020-03-31 | Discharge: 2020-03-31 | Disposition: A | Discharge status: Home / Self Care | Payer: BC Managed Care – PPO | Source: Ambulatory Visit | Attending: Orthopedic Surgery | Admitting: Orthopedic Surgery

## 2020-03-31 DIAGNOSIS — Z01812 Encounter for preprocedural laboratory examination: Secondary | ICD-10-CM | POA: Insufficient documentation

## 2020-03-31 DIAGNOSIS — Z01818 Encounter for other preprocedural examination: Secondary | ICD-10-CM | POA: Diagnosis not present

## 2020-03-31 LAB — CBC
HCT: 41 % (ref 39.0–52.0)
Hemoglobin: 14.3 g/dL (ref 13.0–17.0)
MCH: 35.8 pg — ABNORMAL HIGH (ref 26.0–34.0)
MCHC: 34.9 g/dL (ref 30.0–36.0)
MCV: 102.5 fL — ABNORMAL HIGH (ref 80.0–100.0)
Platelets: 178 10*3/uL (ref 150–400)
RBC: 4 MIL/uL — ABNORMAL LOW (ref 4.22–5.81)
RDW: 13.2 % (ref 11.5–15.5)
WBC: 5.1 10*3/uL (ref 4.0–10.5)
nRBC: 0 % (ref 0.0–0.2)

## 2020-03-31 LAB — SURGICAL PCR SCREEN
MRSA, PCR: NEGATIVE
Staphylococcus aureus: NEGATIVE

## 2020-03-31 NOTE — Progress Notes (Addendum)
COVID Vaccine Completed:Yes Date COVID Vaccine completed:05/2019 J&J, Booster 12/2019- Moderna COVID vaccine manufacturer:  Moderna   [employer]   PCP - Dr. Audie Pinto Cardiologist - none  Chest x-ray - no EKG - no Stress Test - no ECHO - no Cardiac Cath - no Pacemaker/ICD device last checked:NA  Sleep Study - noCPAP -   Fasting Blood Sugar - NA Checks Blood Sugar _____ times a day  Blood Thinner Instructions:NA Aspirin Instructions: Last Dose:  Anesthesia review:   Patient denies shortness of breath, fever, cough and chest pain at PAT appointment yes  Patient verbalized understanding of instructions that were given to them at the PAT appointment. Patient was also instructed that they will need to review over the PAT instructions again at home before surgery. Yes Pt works out and reports no SOB with any activities. He has Myositis that is in remission.

## 2020-04-02 ENCOUNTER — Other Ambulatory Visit (HOSPITAL_COMMUNITY)
Admission: RE | Admit: 2020-04-02 | Discharge: 2020-04-02 | Disposition: A | Discharge status: Home / Self Care | Payer: BC Managed Care – PPO | Source: Ambulatory Visit | Attending: Orthopedic Surgery | Admitting: Orthopedic Surgery

## 2020-04-02 DIAGNOSIS — Z20822 Contact with and (suspected) exposure to covid-19: Secondary | ICD-10-CM | POA: Insufficient documentation

## 2020-04-02 DIAGNOSIS — Z01812 Encounter for preprocedural laboratory examination: Secondary | ICD-10-CM | POA: Diagnosis not present

## 2020-04-02 LAB — SARS CORONAVIRUS 2 (TAT 6-24 HRS): SARS Coronavirus 2: NEGATIVE

## 2020-04-05 ENCOUNTER — Other Ambulatory Visit (HOSPITAL_COMMUNITY): Payer: Self-pay | Admitting: Orthopedic Surgery

## 2020-04-05 MED FILL — METHOCARBAMOL 500 MG TABS: 500 | 12 days supply | Qty: 50 | Fill #0

## 2020-04-05 MED FILL — HYDROCODON-APAP 7.5-325: 7.5-325 | 10 days supply | Qty: 60 | Fill #0

## 2020-04-05 NOTE — H&P (Signed)
TOTAL HIP ADMISSION H&P  Patient is admitted for left total hip arthroplasty, anterior approach.  Subjective:  Chief Complaint: Left hip OA / pain  HPI: Eric Martinez, 60 y.o. male, has a history of pain and functional disability in the left hip due to arthritis and patient has failed non-surgical conservative treatments for greater than 12 weeks to include NSAID's and/or analgesics and activity modification.  Onset of symptoms was gradual starting ~1 year ago with gradually worsening course since that time.The patient noted no past surgery on the left hip.  Patient currently rates pain in the left hip at 7 out of 10 with activity. Patient has night pain, worsening of pain with activity and weight bearing, pain that interfers with activities of daily living and pain with passive range of motion. Patient has evidence of periarticular osteophytes and joint space narrowing by imaging studies. This condition presents safety issues increasing the risk of falls.  There is no current active infection.  Risks, benefits and expectations were discussed with the patient.  Risks including but not limited to the risk of anesthesia, blood clots, nerve damage, blood vessel damage, failure of the prosthesis, infection and up to and including death.  Patient understand the risks, benefits and expectations and wishes to proceed with surgery.   D/C Plans:       Home   Post-op Meds:       Rx given for ASA, Robaxin, Norco, Iron, Colace and MiraLax  Tranexamic Acid:      To be given - IV   Decadron:      Is to be given  FYI:      ASA  Norco  DME:   Rx sent for - RW & 3-N-1  PT:   HEP  Pharmacy: [facility]    Patient Active Problem List   Diagnosis Date Noted  . 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 Need for tetanus booster 06/20/2015  . Puncture wound of foot 06/20/2015  . Muscle weakness 12/17/2013   Past Medical History:  Diagnosis Date  . Cancer ([place]) 2017   throat  .  Hyperlipidemia   . Myositis 11/2013    Past Surgical History:  Procedure Laterality Date  . APPENDECTOMY    . MINOR MUSCLE BIOPSY Right 11/18/2013   Procedure: MINOR MUSCLE BIOPSY;  Surgeon: Edward Jolly, MD;  Location: [facility];  Service: General;  Laterality: Right;    No current facility-administered medications for this encounter.   Current Outpatient Medications  Medication Sig Dispense Refill Last Dose  . Cholecalciferol (VITAMIN D3) 10 MCG (400 UNIT) CAPS Take 400 mg by mouth daily.     Marland Kitchen FOLIC ACID PO Take 3,614 mg by mouth daily.     . hydroxychloroquine (PLAQUENIL) 200 MG tablet Take 400 mg by mouth daily.     . methotrexate (50 MG/ML) 1 G injection Inject 25 mg into the vein once a week. Monday     . Multiple Vitamin (MULTIVITAMIN) tablet Take 1 tablet by mouth daily. Reported on 06/20/2015     . predniSONE (DELTASONE) 5 MG tablet Take 5 mg by mouth daily with breakfast.      Allergies  Allergen Reactions  . Ciprofloxacin Diarrhea    Social History   Tobacco Use  . Smoking status: Former Smoker    Packs/day: 0.00    Years: 5.00    Pack years: 0.00    Types: Cigarettes    Quit date: 2005  Years since quitting: 17.0  . Smokeless tobacco: Never Used  . Tobacco comment: 2010  Substance Use Topics  . Alcohol use: Yes    Comment: 3-5 beers/nightly, more on the weeekends for at least 20 years    Family History  Problem Relation Age of Onset  . Pancreatic cancer Father        Deceased  . Alzheimer's disease Mother        Deceased  . Hypertension Mother   . Healthy Sister   . Hypertension Sister   . Healthy Brother      Review of Systems  Constitutional: Negative.   HENT: Negative.   Eyes: Negative.   Respiratory: Negative.   Cardiovascular: Negative.   Gastrointestinal: Negative.   Genitourinary: Negative.   Musculoskeletal: Positive for joint pain.  Skin: Negative.   Neurological: Negative.   Endo/Heme/Allergies: Negative.    Psychiatric/Behavioral: Negative.       Objective:  Physical Exam Constitutional:      Appearance: He is well-developed.  HENT:     Head: Normocephalic.  Eyes:     Pupils: Pupils are equal, round, and reactive to light.  Neck:     Thyroid: No thyromegaly.     Vascular: No JVD.     Trachea: No tracheal deviation.  Cardiovascular:     Rate and Rhythm: Normal rate and regular rhythm.     Pulses: Intact distal pulses.  Pulmonary:     Effort: Pulmonary effort is normal. No respiratory distress.     Breath sounds: Normal breath sounds. No wheezing.  Abdominal:     Palpations: Abdomen is soft.     Tenderness: There is no abdominal tenderness. There is no guarding.  Musculoskeletal:     Cervical back: Neck supple.     Left hip: Tenderness and bony tenderness present. Decreased range of motion. Decreased strength.  Lymphadenopathy:     Cervical: No cervical adenopathy.  Skin:    General: Skin is warm and dry.  Neurological:     Mental Status: He is alert and oriented to person, place, and time.  Psychiatric:        Mood and Affect: Mood and affect normal.      Labs:  Estimated body mass index is 28.17 kg/m as calculated from the following:   Height as of 03/31/20: 5\' 11"  (1.803 m).   Weight as of 03/31/20: 91.6 kg.   Imaging Review Plain radiographs demonstrate severe degenerative joint disease of the left hip. The bone quality appears to be good for age and reported activity level.      Assessment/Plan:  End stage arthritis, left hip  The patient history, physical examination, clinical judgement of the provider and imaging studies are consistent with end stage degenerative joint disease of the left hip and total hip arthroplasty is deemed medically necessary. The treatment options including medical management, injection therapy, arthroscopy and arthroplasty were discussed at length. The risks and benefits of total hip arthroplasty were presented and reviewed. The  risks due to aseptic loosening, infection, stiffness, dislocation/subluxation,  thromboembolic complications and other imponderables were discussed.  The patient acknowledged the explanation, agreed to proceed with the plan and consent was signed. Patient is being admitted for treatment for surgery, pain control, PT, OT, prophylactic antibiotics, VTE prophylaxis, progressive ambulation and ADL's and discharge planning.The patient is planning to be discharged home.

## 2020-04-06 ENCOUNTER — Ambulatory Visit (HOSPITAL_COMMUNITY)
Admission: RE | Admit: 2020-04-06 | Discharge: 2020-04-06 | Disposition: A | Discharge status: Home / Self Care | Payer: BC Managed Care – PPO | Attending: Orthopedic Surgery | Admitting: Orthopedic Surgery

## 2020-04-06 ENCOUNTER — Ambulatory Visit (HOSPITAL_COMMUNITY): Payer: BC Managed Care – PPO | Admitting: Physician Assistant

## 2020-04-06 ENCOUNTER — Ambulatory Visit (HOSPITAL_COMMUNITY): Payer: BC Managed Care – PPO

## 2020-04-06 ENCOUNTER — Ambulatory Visit (HOSPITAL_COMMUNITY): Payer: BC Managed Care – PPO | Admitting: Certified Registered Nurse Anesthetist

## 2020-04-06 ENCOUNTER — Encounter (HOSPITAL_COMMUNITY)
Admission: RE | Disposition: A | Discharge status: Home / Self Care | Payer: Self-pay | Source: Home / Self Care | Attending: Orthopedic Surgery

## 2020-04-06 ENCOUNTER — Encounter (HOSPITAL_COMMUNITY): Payer: Self-pay | Admitting: Orthopedic Surgery

## 2020-04-06 DIAGNOSIS — Z87891 Personal history of nicotine dependence: Secondary | ICD-10-CM | POA: Diagnosis not present

## 2020-04-06 DIAGNOSIS — M1612 Unilateral primary osteoarthritis, left hip: Secondary | ICD-10-CM | POA: Diagnosis not present

## 2020-04-06 DIAGNOSIS — E785 Hyperlipidemia, unspecified: Secondary | ICD-10-CM | POA: Diagnosis not present

## 2020-04-06 DIAGNOSIS — Z881 Allergy status to other antibiotic agents status: Secondary | ICD-10-CM | POA: Insufficient documentation

## 2020-04-06 DIAGNOSIS — Z79899 Other long term (current) drug therapy: Secondary | ICD-10-CM | POA: Insufficient documentation

## 2020-04-06 DIAGNOSIS — Z471 Aftercare following joint replacement surgery: Secondary | ICD-10-CM | POA: Diagnosis not present

## 2020-04-06 DIAGNOSIS — M25552 Pain in left hip: Secondary | ICD-10-CM

## 2020-04-06 DIAGNOSIS — Z96642 Presence of left artificial hip joint: Secondary | ICD-10-CM | POA: Diagnosis not present

## 2020-04-06 DIAGNOSIS — M879 Osteonecrosis, unspecified: Secondary | ICD-10-CM | POA: Insufficient documentation

## 2020-04-06 DIAGNOSIS — M25752 Osteophyte, left hip: Secondary | ICD-10-CM | POA: Diagnosis not present

## 2020-04-06 DIAGNOSIS — M87052 Idiopathic aseptic necrosis of left femur: Secondary | ICD-10-CM | POA: Diagnosis not present

## 2020-04-06 DIAGNOSIS — G709 Myoneural disorder, unspecified: Secondary | ICD-10-CM | POA: Diagnosis not present

## 2020-04-06 DIAGNOSIS — Z7952 Long term (current) use of systemic steroids: Secondary | ICD-10-CM | POA: Diagnosis not present

## 2020-04-06 DIAGNOSIS — Z96649 Presence of unspecified artificial hip joint: Secondary | ICD-10-CM

## 2020-04-06 HISTORY — PX: TOTAL HIP ARTHROPLASTY: SHX124

## 2020-04-06 LAB — TYPE AND SCREEN
ABO/RH(D): O NEG
Antibody Screen: NEGATIVE

## 2020-04-06 LAB — ABO/RH: ABO/RH(D): O NEG

## 2020-04-06 SURGERY — ARTHROPLASTY, HIP, TOTAL, ANTERIOR APPROACH
Anesthesia: Spinal | Site: Hip | Laterality: Left

## 2020-04-06 MED ORDER — HYDROCODONE-ACETAMINOPHEN 7.5-325 MG PO TABS
ORAL_TABLET | ORAL | Status: AC
  Filled 2020-04-06: qty 1

## 2020-04-06 MED ORDER — METHOCARBAMOL 500 MG IVPB - SIMPLE MED
500.0000 mg | Freq: Four times a day (QID) | INTRAVENOUS | Status: DC | PRN
  Administered 2020-04-06: 500 mg via INTRAVENOUS

## 2020-04-06 MED ORDER — ACETAMINOPHEN 325 MG PO TABS: 325.0000 mg | ORAL_TABLET | Freq: Four times a day (QID) | ORAL | Status: DC | PRN

## 2020-04-06 MED ORDER — PROPOFOL 500 MG/50ML IV EMUL
INTRAVENOUS | Status: DC | PRN
  Administered 2020-04-06 (×2): 100 ug/kg/min via INTRAVENOUS

## 2020-04-06 MED ORDER — BUPIVACAINE IN DEXTROSE 0.75-8.25 % IT SOLN
INTRATHECAL | Status: DC | PRN
  Administered 2020-04-06: 1.8 mL via INTRATHECAL

## 2020-04-06 MED ORDER — LACTATED RINGERS IV BOLUS
250.0000 mL | Freq: Once | INTRAVENOUS | Status: AC
  Administered 2020-04-06: 250 mL via INTRAVENOUS

## 2020-04-06 MED ORDER — CEFAZOLIN SODIUM-DEXTROSE 2-4 GM/100ML-% IV SOLN
2.0000 g | INTRAVENOUS | Status: AC
Start: 2020-04-06 — End: 2020-04-06
  Administered 2020-04-06: 2 g via INTRAVENOUS
  Filled 2020-04-06: qty 100

## 2020-04-06 MED ORDER — HYDROCODONE-ACETAMINOPHEN 5-325 MG PO TABS: 1.0000 | ORAL_TABLET | ORAL | Status: DC | PRN

## 2020-04-06 MED ORDER — ONDANSETRON HCL 4 MG/2ML IJ SOLN
INTRAMUSCULAR | Status: DC | PRN
  Administered 2020-04-06: 4 mg via INTRAVENOUS

## 2020-04-06 MED ORDER — LACTATED RINGERS IV SOLN: INTRAVENOUS | Status: DC

## 2020-04-06 MED ORDER — LACTATED RINGERS IV BOLUS
500.0000 mL | Freq: Once | INTRAVENOUS | Status: AC
  Administered 2020-04-06: 500 mL via INTRAVENOUS

## 2020-04-06 MED ORDER — PROPOFOL 500 MG/50ML IV EMUL
INTRAVENOUS | Status: DC | PRN
  Administered 2020-04-06 (×2): 20 mg via INTRAVENOUS

## 2020-04-06 MED ORDER — HYDROCODONE-ACETAMINOPHEN 7.5-325 MG PO TABS
1.0000 | ORAL_TABLET | ORAL | Status: DC | PRN
  Administered 2020-04-06: 1 via ORAL

## 2020-04-06 MED ORDER — TRANEXAMIC ACID-NACL 1000-0.7 MG/100ML-% IV SOLN
1000.0000 mg | Freq: Once | INTRAVENOUS | Status: AC
  Administered 2020-04-06: 1000 mg via INTRAVENOUS

## 2020-04-06 MED ORDER — 0.9 % SODIUM CHLORIDE (POUR BTL) OPTIME
TOPICAL | Status: DC | PRN
  Administered 2020-04-06: 1000 mL

## 2020-04-06 MED ORDER — METHOCARBAMOL 500 MG PO TABS: 500.0000 mg | ORAL_TABLET | Freq: Four times a day (QID) | ORAL | Status: DC | PRN

## 2020-04-06 MED ORDER — CHLORHEXIDINE GLUCONATE 0.12 % MT SOLN: 15.0000 mL | Freq: Once | OROMUCOSAL | Status: DC

## 2020-04-06 MED ORDER — ORAL CARE MOUTH RINSE: 15.0000 mL | Freq: Once | OROMUCOSAL | Status: DC

## 2020-04-06 MED ORDER — TRANEXAMIC ACID-NACL 1000-0.7 MG/100ML-% IV SOLN
1000.0000 mg | INTRAVENOUS | Status: AC
  Administered 2020-04-06: 1000 mg via INTRAVENOUS
  Filled 2020-04-06: qty 100

## 2020-04-06 MED ORDER — TRANEXAMIC ACID-NACL 1000-0.7 MG/100ML-% IV SOLN
INTRAVENOUS | Status: AC
  Filled 2020-04-06: qty 100

## 2020-04-06 MED ORDER — DEXAMETHASONE SODIUM PHOSPHATE 10 MG/ML IJ SOLN
10.0000 mg | Freq: Once | INTRAMUSCULAR | Status: AC
  Administered 2020-04-06: 10 mg via INTRAVENOUS

## 2020-04-06 MED ORDER — METHOCARBAMOL 500 MG IVPB - SIMPLE MED
INTRAVENOUS | Status: AC
  Filled 2020-04-06: qty 50

## 2020-04-06 MED ORDER — STERILE WATER FOR IRRIGATION IR SOLN
Status: DC | PRN
  Administered 2020-04-06: 2000 mL

## 2020-04-06 SURGICAL SUPPLY — 44 items
BAG DECANTER FOR FLEXI CONT (MISCELLANEOUS) IMPLANT
BAG ZIPLOCK 12X15 (MISCELLANEOUS) IMPLANT
BLADE SAG 18X100X1.27 (BLADE) ×2 IMPLANT
BLADE SURG SZ10 CARB STEEL (BLADE) ×4 IMPLANT
COVER PERINEAL POST (MISCELLANEOUS) ×2 IMPLANT
COVER SURGICAL LIGHT HANDLE (MISCELLANEOUS) ×2 IMPLANT
COVER WAND RF STERILE (DRAPES) IMPLANT
CUP ACETBLR 52 OD PINNACLE (Hips) ×2 IMPLANT
DERMABOND ADVANCED (GAUZE/BANDAGES/DRESSINGS) ×1
DERMABOND ADVANCED .7 DNX12 (GAUZE/BANDAGES/DRESSINGS) ×1 IMPLANT
DRAPE STERI IOBAN 125X83 (DRAPES) ×2 IMPLANT
DRAPE U-SHAPE 47X51 STRL (DRAPES) ×4 IMPLANT
DRESSING AQUACEL AG SP 3.5X10 (GAUZE/BANDAGES/DRESSINGS) ×1 IMPLANT
DRSG AQUACEL AG ADV 3.5X10 (GAUZE/BANDAGES/DRESSINGS) ×2 IMPLANT
DRSG AQUACEL AG SP 3.5X10 (GAUZE/BANDAGES/DRESSINGS) ×2
DURAPREP 26ML APPLICATOR (WOUND CARE) ×2 IMPLANT
ELECT REM PT RETURN 15FT ADLT (MISCELLANEOUS) ×2 IMPLANT
ELIMINATOR HOLE APEX DEPUY (Hips) ×2 IMPLANT
GLOVE BIOGEL PI IND STRL 8.5 (GLOVE) ×1 IMPLANT
GLOVE BIOGEL PI INDICATOR 8.5 (GLOVE) ×1
GLOVE ECLIPSE 8.0 STRL XLNG CF (GLOVE) ×4 IMPLANT
GLOVE ORTHO TXT STRL SZ7.5 (GLOVE) ×4 IMPLANT
GLOVE SURG ENC MOIS LTX SZ6 (GLOVE) ×4 IMPLANT
GLOVE SURG UNDER POLY LF SZ6.5 (GLOVE) ×2 IMPLANT
GLOVE SURG UNDER POLY LF SZ7.5 (GLOVE) ×2 IMPLANT
GOWN STRL REUS W/TWL LRG LVL3 (GOWN DISPOSABLE) ×4 IMPLANT
GOWN STRL REUS W/TWL XL LVL3 (GOWN DISPOSABLE) ×2 IMPLANT
HEAD CERAMIC DELTA 36 PLUS 1.5 (Hips) ×2 IMPLANT
HOLDER FOLEY CATH W/STRAP (MISCELLANEOUS) ×2 IMPLANT
KIT TURNOVER KIT A (KITS) IMPLANT
LINER NEUTRAL 52X36MM PLUS 4 (Liner) ×2 IMPLANT
PACK ANTERIOR HIP CUSTOM (KITS) ×2 IMPLANT
PENCIL SMOKE EVACUATOR (MISCELLANEOUS) IMPLANT
SCREW 6.5MMX30MM (Screw) ×2 IMPLANT
STEM FEM ACTIS HIGH SZ7 (Stem) ×2 IMPLANT
SUT MNCRL AB 4-0 PS2 18 (SUTURE) ×2 IMPLANT
SUT STRATAFIX 0 PDS 27 VIOLET (SUTURE) ×2
SUT VIC AB 1 CT1 36 (SUTURE) ×6 IMPLANT
SUT VIC AB 2-0 CT1 27 (SUTURE) ×4
SUT VIC AB 2-0 CT1 TAPERPNT 27 (SUTURE) ×2 IMPLANT
SUTURE STRATFX 0 PDS 27 VIOLET (SUTURE) ×1 IMPLANT
TRAY FOLEY MTR SLVR 16FR STAT (SET/KITS/TRAYS/PACK) IMPLANT
TUBE SUCTION HIGH CAP CLEAR NV (SUCTIONS) ×2 IMPLANT
WATER STERILE IRR 1000ML POUR (IV SOLUTION) ×2 IMPLANT

## 2020-04-06 NOTE — Transfer of Care (Signed)
Immediate Anesthesia Transfer of Care Note  Patient: Eric Martinez  Procedure(s) Performed: TOTAL HIP ARTHROPLASTY ANTERIOR APPROACH (Left Hip)  Patient Location: PACU  Anesthesia Type:Spinal  Level of Consciousness: awake, alert  and oriented  Airway & Oxygen Therapy: Patient Spontanous Breathing and Patient connected to face mask  Post-op Assessment: Report given to RN and Post -op Vital signs reviewed and stable  Post vital signs: Reviewed and stable  Last Vitals:  Vitals Value Taken Time  BP 100/66 04/06/20 1327  Temp    Pulse 72 04/06/20 1329  Resp 19 04/06/20 1329  SpO2 97 % 04/06/20 1329  Vitals shown include unvalidated device data.  Last Pain:  Vitals:   04/06/20 0919  TempSrc: Oral  PainSc: 3       Patients Stated Pain Goal: 4 (61/44/31 5400)  Complications: No complications documented.

## 2020-04-06 NOTE — Op Note (Signed)
NAME:  Eric Martinez                ACCOUNT NO.: [id number]      MEDICAL RECORD NO.: [id number]      FACILITY:  [facility]      PHYSICIAN:  Mauri Pole  DATE OF BIRTH:  Jan 14, 1961     DATE OF PROCEDURE:  04/06/2020                                 OPERATIVE REPORT         PREOPERATIVE DIAGNOSIS: Left  hip avascular necrosis.      POSTOPERATIVE DIAGNOSIS:  Left hip avascular necrosis      PROCEDURE:  Left total hip replacement through an anterior approach   utilizing DePuy THR system, component size 52 mm pinnacle cup, a size 36+4 neutral   Altrex liner, a size 7 Hi Actis stem with a 36+1.5 delta ceramic   ball.      SURGEON:  Pietro Cassis. Alvan Dame, M.D.      ASSISTANT:  Griffith Citron, PA-C     ANESTHESIA:  Spinal.      SPECIMENS:  None.      COMPLICATIONS:  None.      BLOOD LOSS:  500 cc     DRAINS:  None.      INDICATION OF THE PROCEDURE:  Eric Martinez is a 60 y.o. male who had   presented to office for evaluation of left hip pain.  Radiographs revealed   progressive degenerative changes with bone-on-bone   articulation of the  hip joint, including subchondral cystic changes and osteophytes.  The patient had painful limited range of   motion significantly affecting their overall quality of life and function.  The patient was failing to    respond to conservative measures including medications and/or injections and activity modification and at this point was ready   to proceed with more definitive measures.  Consent was obtained for   benefit of pain relief.  Specific risks of infection, DVT, component   failure, dislocation, neurovascular injury, and need for revision surgery were reviewed in the office as well discussion of   the anterior versus posterior approach were reviewed.     PROCEDURE IN DETAIL:  The patient was brought to operative theater.   Once adequate anesthesia, preoperative antibiotics, 2 gm of Ancef, 1 gm of Tranexamic  Acid, and 10 mg of Decadron were administered, the patient was positioned supine on the [employer] table.  Once the patient was safely positioned with adequate padding of boney prominences we predraped out the hip, and used fluoroscopy to confirm orientation of the pelvis.      The left hip was then prepped and draped from proximal iliac crest to   mid thigh with a shower curtain technique.      Time-out was performed identifying the patient, planned procedure, and the appropriate extremity.     An incision was then made 2 cm lateral to the   anterior superior iliac spine extending over the orientation of the   tensor fascia lata muscle and sharp dissection was carried down to the   fascia of the muscle.      The fascia was then incised.  The muscle belly was identified and swept   laterally and retractor placed along the superior neck.  Following   cauterization of the circumflex vessels and  removing some pericapsular   fat, a second cobra retractor was placed on the inferior neck.  A T-capsulotomy was made along the line of the   superior neck to the trochanteric fossa, then extended proximally and   distally.  Tag sutures were placed and the retractors were then placed   intracapsular.  We then identified the trochanteric fossa and   orientation of my neck cut and then made a neck osteotomy with the femur on traction.  The femoral   head was removed without difficulty or complication.  Traction was let   off and retractors were placed posterior and anterior around the   acetabulum.      The labrum and foveal tissue were debrided.  I began reaming with a 48 mm   reamer and reamed up to 51 mm reamer with good bony bed preparation and a 52 mm  cup was chosen.  The final 52 mm Pinnacle cup was then impacted under fluoroscopy to confirm the depth of penetration and orientation with respect to   Abduction and forward flexion.  A screw was placed into the ilium followed by the hole eliminator.   The final   36+4 neutral Altrex liner was impacted with good visualized rim fit.  The cup was positioned anatomically within the acetabular portion of the pelvis.      At this point, the femur was rolled to 100 degrees.  Further capsule was   released off the inferior aspect of the femoral neck.  I then   released the superior capsule proximally.  With the leg in a neutral position the hook was placed laterally   along the femur under the vastus lateralis origin and elevated manually and then held in position using the hook attachment on the bed.  The leg was then extended and adducted with the leg rolled to 100   degrees of external rotation.  Retractors were placed along the medial calcar and posteriorly over the greater trochanter.  Once the proximal femur was fully   exposed, I used a box osteotome to set orientation.  I then began   broaching with the starting chili pepper broach and passed this by hand and then broached up to 7.  With the 7 broach in place I chose a high offset neck and did several trial reductions.  The offset was appropriate, leg lengths   appeared to be equal best matched with the +1.5 head ball trial confirmed radiographically.   Given these findings, I went ahead and dislocated the hip, repositioned all   retractors and positioned the right hip in the extended and abducted position.  The final 7 Hi  Actis stem was   chosen and it was impacted down to the level of neck cut.  Based on this   and the trial reductions, a final 36+1.5 delta ceramic ball was chosen and   impacted onto a clean and dry trunnion, and the hip was reduced.  The   hip had been irrigated throughout the case again at this point.  I did   reapproximate the superior capsular leaflet to the anterior leaflet   using #1 Vicryl.  The fascia of the   tensor fascia lata muscle was then reapproximated using #1 Vicryl and #0 Stratafix sutures.  The   remaining wound was closed with 2-0 Vicryl and running 4-0  Monocryl.   The hip was cleaned, dried, and dressed sterilely using Dermabond and   Aquacel dressing.  The patient was then brought  to recovery room in stable condition tolerating the procedure well.    Griffith Citron, PA-C was present for the entirety of the case involved from   preoperative positioning, perioperative retractor management, general   facilitation of the case, as well as primary wound closure as assistant.            Pietro Cassis Alvan Dame, M.D.        04/06/2020 10:33 AM

## 2020-04-06 NOTE — Anesthesia Preprocedure Evaluation (Addendum)
Anesthesia Evaluation  Patient identified by MRN, date of birth, ID band Patient awake    Reviewed: Allergy & Precautions, NPO status , Patient's Chart, lab work & pertinent test results  Airway Mallampati: II  TM Distance: >3 FB Neck ROM: Full    Dental  (+) Teeth Intact, Dental Advisory Given   Pulmonary former smoker,    breath sounds clear to auscultation       Cardiovascular negative cardio ROS   Rhythm:Regular Rate:Normal     Neuro/Psych  Neuromuscular disease negative psych ROS   GI/Hepatic negative GI ROS, Neg liver ROS,   Endo/Other  negative endocrine ROS  Renal/GU negative Renal ROS     Musculoskeletal negative musculoskeletal ROS (+)   Abdominal Normal abdominal exam  (+)   Peds  Hematology negative hematology ROS (+)   Anesthesia Other Findings - HLD  Reproductive/Obstetrics                            Anesthesia Physical Anesthesia Plan  ASA: II  Anesthesia Plan: Spinal   Post-op Pain Management:    Induction: Intravenous  PONV Risk Score and Plan: 2 and Ondansetron and Propofol infusion  Airway Management Planned: Natural Airway and Simple Face Mask  Additional Equipment:   Intra-op Plan:   Post-operative Plan:   Informed Consent: I have reviewed the patients History and Physical, chart, labs and discussed the procedure including the risks, benefits and alternatives for the proposed anesthesia with the patient or authorized representative who has indicated his/her understanding and acceptance.       Plan Discussed with: CRNA  Anesthesia Plan Comments:        Anesthesia Quick Evaluation

## 2020-04-06 NOTE — Anesthesia Postprocedure Evaluation (Signed)
Anesthesia Post Note  Patient: Eric Martinez  Procedure(s) Performed: TOTAL HIP ARTHROPLASTY ANTERIOR APPROACH (Left Hip)     Patient location during evaluation: PACU Anesthesia Type: Spinal Level of consciousness: oriented and awake and alert Pain management: pain level controlled Vital Signs Assessment: post-procedure vital signs reviewed and stable Respiratory status: spontaneous breathing, respiratory function stable and patient connected to nasal cannula oxygen Cardiovascular status: blood pressure returned to baseline and stable Postop Assessment: no headache, no backache and no apparent nausea or vomiting Anesthetic complications: no   No complications documented.  Last Vitals:  Vitals:   04/06/20 1446 04/06/20 1455  BP:  (!) 155/90  Pulse: 62 61  Resp: 12 12  Temp:    SpO2: 99% 99%    Last Pain:  Vitals:   04/06/20 1455  TempSrc:   PainSc: 0-No pain                 Effie Berkshire

## 2020-04-06 NOTE — Discharge Instructions (Signed)
INSTRUCTIONS AFTER JOINT REPLACEMENT   Hold Plaquenil for 2 weeks following surgery.   o Remove items at home which could result in a fall. This includes throw rugs or furniture in walking pathways o ICE to the affected joint every three hours while awake for 30 minutes at a time, for at least the first 3-5 days, and then as needed for pain and swelling.  Continue to use ice for pain and swelling. You may notice swelling that will progress down to the foot and ankle.  This is normal after surgery.  Elevate your leg when you are not up walking on it.   o Continue to use the breathing machine you got in the hospital (incentive spirometer) which will help keep your temperature down.  It is common for your temperature to cycle up and down following surgery, especially at night when you are not up moving around and exerting yourself.  The breathing machine keeps your lungs expanded and your temperature down.   DIET:  As you were doing prior to hospitalization, we recommend a well-balanced diet.  DRESSING / WOUND CARE / SHOWERING  Keep the surgical dressing until follow up.  The dressing is water proof, so you can shower without any extra covering.  IF THE DRESSING FALLS OFF or the wound gets wet inside, change the dressing with sterile gauze.  Please use good hand washing techniques before changing the dressing.  Do not use any lotions or creams on the incision until instructed by your surgeon.    ACTIVITY  o Increase activity slowly as tolerated, but follow the weight bearing instructions below.   o No driving for 6 weeks or until further direction given by your physician.  You cannot drive while taking narcotics.  o No lifting or carrying greater than 10 lbs. until further directed by your surgeon. o Avoid periods of inactivity such as sitting longer than an hour when not asleep. This helps prevent blood clots.  o You may return to work once you are authorized by your doctor.     WEIGHT BEARING    Weight bearing as tolerated with assist device (walker, cane, etc) as directed, use it as long as suggested by your surgeon or therapist, typically at least 4-6 weeks.   EXERCISES  Results after joint replacement surgery are often greatly improved when you follow the exercise, range of motion and muscle strengthening exercises prescribed by your doctor. Safety measures are also important to protect the joint from further injury. Any time any of these exercises cause you to have increased pain or swelling, decrease what you are doing until you are comfortable again and then slowly increase them. If you have problems or questions, call your caregiver or physical therapist for advice.   Rehabilitation is important following a joint replacement. After just a few days of immobilization, the muscles of the leg can become weakened and shrink (atrophy).  These exercises are designed to build up the tone and strength of the thigh and leg muscles and to improve motion. Often times heat used for twenty to thirty minutes before working out will loosen up your tissues and help with improving the range of motion but do not use heat for the first two weeks following surgery (sometimes heat can increase post-operative swelling).   These exercises can be done on a training (exercise) mat, on the floor, on a table or on a bed. Use whatever works the best and is most comfortable for you.    Use  music or television while you are exercising so that the exercises are a pleasant break in your day. This will make your life better with the exercises acting as a break in your routine that you can look forward to.   Perform all exercises about fifteen times, three times per day or as directed.  You should exercise both the operative leg and the other leg as well.  Exercises include:    Quad Sets - Tighten up the muscle on the front of the thigh (Quad) and hold for 5-10 seconds.    Straight Leg Raises - With your knee  straight (if you were given a brace, keep it on), lift the leg to 60 degrees, hold for 3 seconds, and slowly lower the leg.  Perform this exercise against resistance later as your leg gets stronger.   Leg Slides: Lying on your back, slowly slide your foot toward your buttocks, bending your knee up off the floor (only go as far as is comfortable). Then slowly slide your foot back down until your leg is flat on the floor again.   Angel Wings: Lying on your back spread your legs to the side as far apart as you can without causing discomfort.   Hamstring Strength:  Lying on your back, push your heel against the floor with your leg straight by tightening up the muscles of your buttocks.  Repeat, but this time bend your knee to a comfortable angle, and push your heel against the floor.  You may put a pillow under the heel to make it more comfortable if necessary.   A rehabilitation program following joint replacement surgery can speed recovery and prevent re-injury in the future due to weakened muscles. Contact your doctor or a physical therapist for more information on knee rehabilitation.    CONSTIPATION  Constipation is defined medically as fewer than three stools per week and severe constipation as less than one stool per week.  Even if you have a regular bowel pattern at home, your normal regimen is likely to be disrupted due to multiple reasons following surgery.  Combination of anesthesia, postoperative narcotics, change in appetite and fluid intake all can affect your bowels.   YOU MUST use at least one of the following options; they are listed in order of increasing strength to get the job done.  They are all available over the counter, and you may need to use some, POSSIBLY even all of these options:    Drink plenty of fluids (prune juice may be helpful) and high fiber foods Colace 100 mg by mouth twice a day  Senokot for constipation as directed and as needed Dulcolax (bisacodyl), take with  full glass of water  Miralax (polyethylene glycol) once or twice a day as needed.  If you have tried all these things and are unable to have a bowel movement in the first 3-4 days after surgery call either your surgeon or your primary doctor.    If you experience loose stools or diarrhea, hold the medications until you stool forms back up.  If your symptoms do not get better within 1 week or if they get worse, check with your doctor.  If you experience "the worst abdominal pain ever" or develop nausea or vomiting, please contact the office immediately for further recommendations for treatment.   ITCHING:  If you experience itching with your medications, try taking only a single pain pill, or even half a pain pill at a time.  You can also use  Benadryl over the counter for itching or also to help with sleep.   TED HOSE STOCKINGS:  Use stockings on both legs until for at least 2 weeks or as directed by physician office. They may be removed at night for sleeping.  MEDICATIONS:  See your medication summary on the After Visit Summary that nursing will review with you.  You may have some home medications which will be placed on hold until you complete the course of blood thinner medication.  It is important for you to complete the blood thinner medication as prescribed.  PRECAUTIONS:  If you experience chest pain or shortness of breath - call 911 immediately for transfer to the hospital emergency department.   If you develop a fever greater that 101 F, purulent drainage from wound, increased redness or drainage from wound, foul odor from the wound/dressing, or calf pain - CONTACT YOUR SURGEON.                                                   FOLLOW-UP APPOINTMENTS:  If you do not already have a post-op appointment, please call the office for an appointment to be seen by your surgeon.  Guidelines for how soon to be seen are listed in your After Visit Summary, but are typically between 1-4 weeks after  surgery.  OTHER INSTRUCTIONS:   Knee Replacement:  Do not place pillow under knee, focus on keeping the knee straight while resting. CPM instructions: 0-90 degrees, 2 hours in the morning, 2 hours in the afternoon, and 2 hours in the evening. Place foam block, curve side up under heel at all times except when in CPM or when walking.  DO NOT modify, tear, cut, or change the foam block in any way.   DENTAL ANTIBIOTICS:  In most cases prophylactic antibiotics for Dental procdeures after total joint surgery are not necessary.  Exceptions are as follows:  1. History of prior total joint infection  2. Severely immunocompromised (Organ Transplant, cancer chemotherapy, Rheumatoid biologic meds such as [place])  3. Poorly controlled diabetes (A1C &gt; 8.0, blood glucose over 200)  If you have one of these conditions, contact your surgeon for an antibiotic prescription, prior to your dental procedure.   MAKE SURE YOU:   Understand these instructions.   Get help right away if you are not doing well or get worse.    Thank you for letting us be a part of your medical care team.  It is a privilege we respect greatly.  We hope these instructions will help you stay on track for a fast and full recovery!

## 2020-04-06 NOTE — Evaluation (Signed)
Physical Therapy Evaluation Patient Details Name: RIDGELY ANASTACIO MRN: [id number] DOB: May 19, 1960 Today's Date: 04/06/2020   History of Present Illness  Patient is 60 y.o. male s/p Lt THA anterior approach on 04/06/20 with PMH significant for HLD, throat cancer, and myositis.  Clinical Impression  Pt is a 60yo male s/p L THA anterior approach POD 0. Pt is independent with mobility at baseline. Pt required MIN guard for safety with sit to stand transfers and verbal cues for hand placement. Pt was able to safely ambulate 4ft with verbal cues for step to gait pattern, pt was able to progress to step through pattern for 80ft with no observed LOB. Pt safely negotiated stairs with use of RW, MIN guard, and verbal cues for technique. Recommend home with family support. [name] wife is available to help 24/7 at home. Pt will benefit from skilled PT to increase their independence and safety with mobility to allow discharge to the venue listed below.      Follow Up Recommendations Follow surgeon's recommendation for DC plan and follow-up therapies;Outpatient PT    Equipment Recommendations  None recommended by PT    Recommendations for Other Services       Precautions / Restrictions Precautions Precautions: Fall Restrictions Weight Bearing Restrictions: No Other Position/Activity Restrictions: WBAT      Mobility  Bed Mobility Overal bed mobility: Needs Assistance;Modified Independent Bed Mobility: Supine to Sit     Supine to sit: Modified independent (Device/Increase time)     General bed mobility comments: Pt was able to transfer supine to sit with supervision and use of UEs on bed rail    Transfers Overall transfer level: Needs assistance Equipment used: Rolling walker (2 wheeled) Transfers: Sit to/from Stand Sit to Stand: Min guard         General transfer comment: Pt was able to transfer sit to stand with MIN guard for safety and verbal cues for hand  placemnent.  Ambulation/Gait Ambulation/Gait assistance: Min guard Gait Distance (Feet): 90 Feet Assistive device: Rolling walker (2 wheeled) Gait Pattern/deviations: Step-to pattern;Step-through pattern;Decreased weight shift to left Gait velocity: decreased   General Gait Details: Pt required verbal cues for gait pattern and RW management. Pt perfomed step through gait pattern for 50ft.  Stairs Stairs: Yes Stairs assistance: Min guard Stair Management: No rails;With walker Number of Stairs: 5 General stair comments: Pt was able to safely negotiate stairs with verbal cues for technique and use of RW. PT educated pt on safe positionig for family memebrs when assisting/guarding.  Wheelchair Mobility    Modified Rankin (Stroke Patients Only)       Balance Overall balance assessment: Needs assistance Sitting-balance support: Feet supported Sitting balance-Leahy Scale: Good     Standing balance support: Bilateral upper extremity supported Standing balance-Leahy Scale: Poor Standing balance comment: Pt required use of RW to maintain standing balance.                             Pertinent Vitals/Pain Pain Assessment: 0-10 Pain Score: 7  Pain Location: L hip Pain Descriptors / Indicators: Tender;Sore Pain Intervention(s): Limited activity within patient's tolerance;Monitored during session;Repositioned    Home Living Family/patient expects to be discharged to:: Private residence Living Arrangements: Spouse/significant other Available Help at Discharge: Family Type of Home: House Home Access: Stairs to enter Entrance Stairs-Rails: None Entrance Stairs-Number of Steps: 1 (curb) Home Layout: One level Home Equipment: Environmental consultant - 2 wheels;Shower seat - built in Additional Comments:  Pt will have help from wife 24/7 at home.    Prior Function Level of Independence: Independent               Hand Dominance   Dominant Hand: Right    Extremity/Trunk  Assessment   Upper Extremity Assessment Upper Extremity Assessment: Overall WFL for tasks assessed    Lower Extremity Assessment Lower Extremity Assessment: LLE deficits/detail LLE Deficits / Details: Pt able to complete quad set with 4/5 strength LLE Sensation: WNL (slightly decreased sensation in pelvic region 2/2 spinal block) LLE Coordination: WNL    Cervical / Trunk Assessment Cervical / Trunk Assessment: Normal  Communication   Communication: No difficulties  Cognition Arousal/Alertness: Awake/alert Behavior During Therapy: WFL for tasks assessed/performed Overall Cognitive Status: Within Functional Limits for tasks assessed                                        General Comments      Exercises Total Joint Exercises Ankle Circles/Pumps: AROM;Both;20 reps;Seated Quad Sets: AROM;Left;5 reps;Seated Short Arc Quad: AROM;5 reps;Seated;Left Heel Slides: AROM;5 reps;Seated;Left Hip ABduction/ADduction: AAROM;Other reps (comment) (x 1 rep with AAROM)   Assessment/Plan    PT Assessment Patient needs continued PT services  PT Problem List Decreased strength;Decreased range of motion;Decreased activity tolerance;Decreased balance;Decreased mobility;Decreased knowledge of use of DME;Decreased safety awareness;Pain       PT Treatment Interventions DME instruction;Gait training;Functional mobility training;Stair training;Therapeutic activities;Therapeutic exercise;Neuromuscular re-education;Balance training;Patient/family education    PT Goals (Current goals can be found in the Care Plan section)  Acute Rehab PT Goals Patient Stated Goal: get back to biking and yoga PT Goal Formulation: With patient Time For Goal Achievement: 04/13/20 Potential to Achieve Goals: Good    Frequency 7X/week   Barriers to discharge        Co-evaluation               AM-PAC PT "6 Clicks" Mobility  Outcome Measure Help needed turning from your back to your side while  in a flat bed without using bedrails?: A Little Help needed moving from lying on your back to sitting on the side of a flat bed without using bedrails?: A Little Help needed moving to and from a bed to a chair (including a wheelchair)?: A Little Help needed standing up from a chair using your arms (e.g., wheelchair or bedside chair)?: A Little Help needed to walk in hospital room?: A Little Help needed climbing 3-5 steps with a railing? : A Little 6 Click Score: 18    End of Session Equipment Utilized During Treatment: Gait belt Activity Tolerance: Patient tolerated treatment well Patient left: in chair;with call bell/phone within reach Nurse Communication: Mobility status PT Visit Diagnosis: Unsteadiness on feet (R26.81);Muscle weakness (generalized) (M62.81);Pain Pain - Right/Left: Left Pain - part of body: Hip    Time: 8786-7672 PT Time Calculation (min) (ACUTE ONLY): 33 min   Charges:              Elna Breslow, SPT  Acute rehab    Elna Breslow 04/06/2020, 6:57 PM

## 2020-04-06 NOTE — Interval H&P Note (Signed)
History and Physical Interval Note:  04/06/2020 8:57 AM  Eric Martinez  has presented today for surgery, with the diagnosis of Left hip osteoarthritis.  The various methods of treatment have been discussed with the patient and family. After consideration of risks, benefits and other options for treatment, the patient has consented to  Procedure(s) with comments: TOTAL HIP ARTHROPLASTY ANTERIOR APPROACH (Left) - 70 mins as a surgical intervention.  The patient's history has been reviewed, patient examined, no change in status, stable for surgery.  I have reviewed the patient's chart and labs.  Questions were answered to the patient's satisfaction.     Mauri Pole

## 2020-04-06 NOTE — Anesthesia Procedure Notes (Signed)
Spinal  Patient location during procedure: OR Start time: 04/06/2020 11:36 AM Staffing Performed: resident/CRNA  Resident/CRNA: Rosaland Lao, CRNA Preanesthetic Checklist Completed: patient identified, IV checked, site marked, risks and benefits discussed, surgical consent, monitors and equipment checked, pre-op evaluation and timeout performed Spinal Block Patient position: sitting Prep: DuraPrep Patient monitoring: heart rate, cardiac monitor, continuous pulse ox and blood pressure Approach: midline Location: L3-4 Injection technique: single-shot Needle Needle type: Pencan  Needle gauge: 24 G Needle length: 10 cm Assessment Sensory level: T4

## 2020-04-07 ENCOUNTER — Encounter (HOSPITAL_COMMUNITY): Payer: Self-pay | Admitting: Orthopedic Surgery

## 2020-04-19 MED FILL — METHOTREXATE 25 MG/ML VIAL: 50 | 28 days supply | Qty: 4 | Fill #11

## 2020-04-19 MED FILL — HYDROXYCHLOROQUINE SULFATE: 200 | 30 days supply | Qty: 60 | Fill #11

## 2020-05-17 ENCOUNTER — Other Ambulatory Visit (HOSPITAL_COMMUNITY): Payer: Self-pay | Admitting: Rheumatology

## 2020-05-17 DIAGNOSIS — Z79899 Other long term (current) drug therapy: Secondary | ICD-10-CM | POA: Diagnosis not present

## 2020-05-17 DIAGNOSIS — M3390 Dermatopolymyositis, unspecified, organ involvement unspecified: Secondary | ICD-10-CM | POA: Diagnosis not present

## 2020-05-17 MED FILL — METHOTREXATE 25 MG/ML VIAL: 50 | 28 days supply | Qty: 4 | Fill #0

## 2020-05-19 DIAGNOSIS — Z96642 Presence of left artificial hip joint: Secondary | ICD-10-CM | POA: Diagnosis not present

## 2020-05-19 DIAGNOSIS — Z471 Aftercare following joint replacement surgery: Secondary | ICD-10-CM | POA: Diagnosis not present

## 2020-05-26 MED FILL — predniSONE 5 MG TABS: 5 | 90 days supply | Qty: 90 | Fill #1

## 2020-05-28 ENCOUNTER — Other Ambulatory Visit (HOSPITAL_COMMUNITY): Payer: Self-pay | Admitting: Rheumatology

## 2020-05-28 MED FILL — HYDROXYCHLOROQUINE SULFATE: 200 | 30 days supply | Qty: 60 | Fill #0

## 2020-06-11 DIAGNOSIS — M79604 Pain in right leg: Secondary | ICD-10-CM | POA: Diagnosis not present

## 2020-06-15 MED FILL — METHOTREXATE 25 MG/ML VIAL: 50 | 28 days supply | Qty: 4 | Fill #1

## 2020-06-16 DIAGNOSIS — M79651 Pain in right thigh: Secondary | ICD-10-CM | POA: Diagnosis not present

## 2020-06-16 DIAGNOSIS — Z96642 Presence of left artificial hip joint: Secondary | ICD-10-CM | POA: Diagnosis not present

## 2020-06-16 DIAGNOSIS — M61051 Myositis ossificans traumatica, right thigh: Secondary | ICD-10-CM | POA: Diagnosis not present

## 2020-06-25 ENCOUNTER — Other Ambulatory Visit (HOSPITAL_COMMUNITY): Payer: Self-pay

## 2020-06-25 MED FILL — Hydroxychloroquine Sulfate Tab 200 MG: ORAL | 30 days supply | Qty: 60 | Fill #0 | Status: AC

## 2020-07-06 ENCOUNTER — Other Ambulatory Visit (HOSPITAL_COMMUNITY): Payer: Self-pay

## 2020-07-06 MED FILL — Methotrexate Sodium Inj 50 MG/2ML (25 MG/ML): INTRAMUSCULAR | 28 days supply | Qty: 4 | Fill #0 | Status: AC

## 2020-07-09 ENCOUNTER — Other Ambulatory Visit (HOSPITAL_COMMUNITY): Payer: Self-pay

## 2020-07-12 ENCOUNTER — Other Ambulatory Visit (HOSPITAL_COMMUNITY): Payer: Self-pay

## 2020-07-27 ENCOUNTER — Other Ambulatory Visit (HOSPITAL_COMMUNITY): Payer: Self-pay

## 2020-07-27 MED FILL — Hydroxychloroquine Sulfate Tab 200 MG: ORAL | 30 days supply | Qty: 60 | Fill #1 | Status: AC

## 2020-08-02 MED FILL — Methotrexate Sodium Inj 50 MG/2ML (25 MG/ML): INTRAMUSCULAR | 28 days supply | Qty: 4 | Fill #1 | Status: AC

## 2020-08-03 ENCOUNTER — Other Ambulatory Visit (HOSPITAL_COMMUNITY): Payer: Self-pay

## 2020-08-06 ENCOUNTER — Other Ambulatory Visit (HOSPITAL_COMMUNITY): Payer: Self-pay

## 2020-08-12 ENCOUNTER — Other Ambulatory Visit (HOSPITAL_COMMUNITY): Payer: Self-pay

## 2020-08-12 DIAGNOSIS — B370 Candidal stomatitis: Secondary | ICD-10-CM | POA: Diagnosis not present

## 2020-08-12 DIAGNOSIS — K1321 Leukoplakia of oral mucosa, including tongue: Secondary | ICD-10-CM | POA: Diagnosis not present

## 2020-08-12 DIAGNOSIS — J029 Acute pharyngitis, unspecified: Secondary | ICD-10-CM | POA: Diagnosis not present

## 2020-08-12 DIAGNOSIS — Z85819 Personal history of malignant neoplasm of unspecified site of lip, oral cavity, and pharynx: Secondary | ICD-10-CM | POA: Diagnosis not present

## 2020-08-12 MED ORDER — NYSTATIN 100000 UNIT/ML MT SUSP
4.0000 mL | Freq: Four times a day (QID) | OROMUCOSAL | 1 refills | Status: AC
  Filled 2020-08-12: qty 480, 30d supply, fill #0

## 2020-08-13 ENCOUNTER — Other Ambulatory Visit (HOSPITAL_COMMUNITY): Payer: Self-pay

## 2020-08-13 MED ORDER — AZITHROMYCIN 250 MG PO TABS
ORAL_TABLET | Freq: Every day | ORAL | 0 refills | Status: AC
  Filled 2020-08-13: qty 6, 5d supply, fill #0

## 2020-08-13 MED ORDER — AMOXICILLIN-POT CLAVULANATE 500-125 MG PO TABS
1.0000 | ORAL_TABLET | Freq: Two times a day (BID) | ORAL | 0 refills | Status: DC
  Filled 2020-08-13: qty 14, 7d supply, fill #0

## 2020-08-29 MED FILL — Prednisone Tab 5 MG: ORAL | 90 days supply | Qty: 90 | Fill #0 | Status: AC

## 2020-08-29 MED FILL — Hydroxychloroquine Sulfate Tab 200 MG: ORAL | 30 days supply | Qty: 60 | Fill #2 | Status: AC

## 2020-08-30 ENCOUNTER — Other Ambulatory Visit (HOSPITAL_COMMUNITY): Payer: Self-pay

## 2020-08-31 ENCOUNTER — Other Ambulatory Visit (HOSPITAL_COMMUNITY): Payer: Self-pay

## 2020-08-31 MED ORDER — FLUCONAZOLE 150 MG PO TABS
150.0000 mg | ORAL_TABLET | Freq: Every day | ORAL | 0 refills | Status: AC | PRN
  Filled 2020-08-31: qty 10, 10d supply, fill #0

## 2020-09-06 MED FILL — Methotrexate Sodium Inj 50 MG/2ML (25 MG/ML): INTRAMUSCULAR | 28 days supply | Qty: 4 | Fill #2 | Status: AC

## 2020-09-07 ENCOUNTER — Other Ambulatory Visit (HOSPITAL_COMMUNITY): Payer: Self-pay

## 2020-09-29 MED FILL — Hydroxychloroquine Sulfate Tab 200 MG: ORAL | 30 days supply | Qty: 60 | Fill #3 | Status: AC

## 2020-09-29 MED FILL — Methotrexate Sodium Inj 50 MG/2ML (25 MG/ML): INTRAMUSCULAR | 28 days supply | Qty: 4 | Fill #3 | Status: AC

## 2020-09-30 ENCOUNTER — Other Ambulatory Visit (HOSPITAL_COMMUNITY): Payer: Self-pay

## 2020-10-01 DIAGNOSIS — E039 Hypothyroidism, unspecified: Secondary | ICD-10-CM | POA: Diagnosis not present

## 2020-10-01 DIAGNOSIS — Z0000 Encounter for general adult medical examination without abnormal findings: Secondary | ICD-10-CM | POA: Diagnosis not present

## 2020-10-06 DIAGNOSIS — Z0001 Encounter for general adult medical examination with abnormal findings: Secondary | ICD-10-CM | POA: Diagnosis not present

## 2020-10-06 DIAGNOSIS — S81812A Laceration without foreign body, left lower leg, initial encounter: Secondary | ICD-10-CM | POA: Diagnosis not present

## 2020-10-06 DIAGNOSIS — Z125 Encounter for screening for malignant neoplasm of prostate: Secondary | ICD-10-CM | POA: Diagnosis not present

## 2020-10-07 ENCOUNTER — Other Ambulatory Visit: Payer: Self-pay | Admitting: Internal Medicine

## 2020-10-07 DIAGNOSIS — E785 Hyperlipidemia, unspecified: Secondary | ICD-10-CM

## 2020-10-25 MED FILL — Hydroxychloroquine Sulfate Tab 200 MG: ORAL | 30 days supply | Qty: 60 | Fill #4 | Status: AC

## 2020-10-25 MED FILL — Methotrexate Sodium Inj 50 MG/2ML (25 MG/ML): INTRAMUSCULAR | 28 days supply | Qty: 4 | Fill #4 | Status: AC

## 2020-10-26 ENCOUNTER — Other Ambulatory Visit (HOSPITAL_COMMUNITY): Payer: Self-pay

## 2020-10-26 DIAGNOSIS — M3313 Other dermatomyositis without myopathy: Secondary | ICD-10-CM | POA: Diagnosis not present

## 2020-10-26 DIAGNOSIS — Z85819 Personal history of malignant neoplasm of unspecified site of lip, oral cavity, and pharynx: Secondary | ICD-10-CM | POA: Diagnosis not present

## 2020-10-26 DIAGNOSIS — Z79899 Other long term (current) drug therapy: Secondary | ICD-10-CM | POA: Diagnosis not present

## 2020-10-26 DIAGNOSIS — C770 Secondary and unspecified malignant neoplasm of lymph nodes of head, face and neck: Secondary | ICD-10-CM | POA: Diagnosis not present

## 2020-10-26 DIAGNOSIS — Z923 Personal history of irradiation: Secondary | ICD-10-CM | POA: Diagnosis not present

## 2020-10-26 DIAGNOSIS — C01 Malignant neoplasm of base of tongue: Secondary | ICD-10-CM | POA: Diagnosis not present

## 2020-10-26 DIAGNOSIS — D84821 Immunodeficiency due to drugs: Secondary | ICD-10-CM | POA: Diagnosis not present

## 2020-10-26 DIAGNOSIS — Z08 Encounter for follow-up examination after completed treatment for malignant neoplasm: Secondary | ICD-10-CM | POA: Diagnosis not present

## 2020-10-26 MED ORDER — FLUCONAZOLE 100 MG PO TABS
100.0000 mg | ORAL_TABLET | Freq: Every day | ORAL | 0 refills | Status: AC
  Filled 2020-10-26: qty 10, 10d supply, fill #0

## 2020-11-08 ENCOUNTER — Other Ambulatory Visit: Payer: Self-pay | Admitting: Radiation Oncology

## 2020-11-08 DIAGNOSIS — Z85819 Personal history of malignant neoplasm of unspecified site of lip, oral cavity, and pharynx: Secondary | ICD-10-CM

## 2020-11-08 DIAGNOSIS — Z923 Personal history of irradiation: Secondary | ICD-10-CM

## 2020-11-11 ENCOUNTER — Ambulatory Visit
Admission: RE | Admit: 2020-11-11 | Discharge: 2020-11-11 | Disposition: A | Discharge status: Home / Self Care | Payer: BC Managed Care – PPO | Source: Ambulatory Visit | Attending: Radiation Oncology | Admitting: Radiation Oncology

## 2020-11-11 DIAGNOSIS — Z85819 Personal history of malignant neoplasm of unspecified site of lip, oral cavity, and pharynx: Secondary | ICD-10-CM

## 2020-11-11 DIAGNOSIS — Z923 Personal history of irradiation: Secondary | ICD-10-CM

## 2020-11-11 DIAGNOSIS — Z85818 Personal history of malignant neoplasm of other sites of lip, oral cavity, and pharynx: Secondary | ICD-10-CM | POA: Diagnosis not present

## 2020-11-24 ENCOUNTER — Ambulatory Visit
Admission: RE | Admit: 2020-11-24 | Discharge: 2020-11-24 | Disposition: A | Discharge status: Home / Self Care | Payer: No Typology Code available for payment source | Source: Ambulatory Visit | Attending: Internal Medicine | Admitting: Internal Medicine

## 2020-11-24 ENCOUNTER — Other Ambulatory Visit: Payer: Self-pay

## 2020-11-24 DIAGNOSIS — E785 Hyperlipidemia, unspecified: Secondary | ICD-10-CM

## 2020-11-24 MED FILL — Methotrexate Sodium Inj 50 MG/2ML (25 MG/ML): INTRAMUSCULAR | 28 days supply | Qty: 4 | Fill #5 | Status: AC

## 2020-11-24 MED FILL — Prednisone Tab 5 MG: ORAL | 90 days supply | Qty: 90 | Fill #1 | Status: AC

## 2020-11-24 MED FILL — Hydroxychloroquine Sulfate Tab 200 MG: ORAL | 30 days supply | Qty: 60 | Fill #5 | Status: AC

## 2020-11-25 ENCOUNTER — Other Ambulatory Visit (HOSPITAL_COMMUNITY): Payer: Self-pay

## 2020-12-03 ENCOUNTER — Other Ambulatory Visit (HOSPITAL_COMMUNITY): Payer: Self-pay

## 2020-12-03 DIAGNOSIS — R682 Dry mouth, unspecified: Secondary | ICD-10-CM | POA: Diagnosis not present

## 2020-12-03 MED ORDER — ROSUVASTATIN CALCIUM 5 MG PO TABS
5.0000 mg | ORAL_TABLET | Freq: Every day | ORAL | 4 refills | Status: AC
  Filled 2020-12-03: qty 90, 90d supply, fill #0
  Filled 2021-03-06: qty 90, 90d supply, fill #1

## 2020-12-08 ENCOUNTER — Other Ambulatory Visit (HOSPITAL_COMMUNITY): Payer: Self-pay

## 2020-12-08 MED FILL — Methotrexate Sodium Inj 50 MG/2ML (25 MG/ML): INTRAMUSCULAR | 28 days supply | Qty: 4 | Fill #6 | Status: CN

## 2020-12-13 MED FILL — Methotrexate Sodium Inj 50 MG/2ML (25 MG/ML): INTRAMUSCULAR | 28 days supply | Qty: 4 | Fill #6 | Status: CN

## 2020-12-14 ENCOUNTER — Other Ambulatory Visit (HOSPITAL_BASED_OUTPATIENT_CLINIC_OR_DEPARTMENT_OTHER): Payer: Self-pay

## 2020-12-14 ENCOUNTER — Other Ambulatory Visit (HOSPITAL_COMMUNITY): Payer: Self-pay

## 2020-12-14 MED ORDER — NYSTATIN 100000 UNIT/ML MT SUSP
OROMUCOSAL | 3 refills | Status: AC
  Filled 2020-12-14: qty 280, 14d supply, fill #0

## 2020-12-14 MED ORDER — METHOTREXATE SODIUM CHEMO INJECTION 50 MG/2ML
25.0000 mg | INTRAMUSCULAR | 3 refills | Status: AC
  Filled 2020-12-14 – 2020-12-20 (×3): qty 4, 28d supply, fill #0
  Filled 2021-01-10: qty 4, 28d supply, fill #1
  Filled 2021-02-07: qty 4, 28d supply, fill #2
  Filled 2021-03-06: qty 4, 28d supply, fill #3
  Filled 2021-04-04: qty 4, 28d supply, fill #4
  Filled 2021-05-06: qty 4, 28d supply, fill #5
  Filled 2021-06-06: qty 4, 28d supply, fill #6
  Filled 2021-07-04: qty 4, 28d supply, fill #7
  Filled 2021-08-15: qty 4, 28d supply, fill #8
  Filled 2021-09-12: qty 4, 28d supply, fill #9
  Filled 2021-10-10: qty 4, 28d supply, fill #10

## 2020-12-15 ENCOUNTER — Other Ambulatory Visit (HOSPITAL_COMMUNITY): Payer: Self-pay

## 2020-12-20 ENCOUNTER — Other Ambulatory Visit (HOSPITAL_COMMUNITY): Payer: Self-pay

## 2020-12-28 ENCOUNTER — Other Ambulatory Visit (HOSPITAL_COMMUNITY): Payer: Self-pay

## 2020-12-28 MED ORDER — NYSTATIN 100000 UNIT/ML MT SUSP
5.0000 mL | Freq: Four times a day (QID) | OROMUCOSAL | 1 refills | Status: AC
  Filled 2020-12-28: qty 280, 14d supply, fill #0
  Filled 2021-02-21: qty 280, 14d supply, fill #1

## 2020-12-29 ENCOUNTER — Other Ambulatory Visit (HOSPITAL_COMMUNITY): Payer: Self-pay

## 2020-12-29 MED FILL — Hydroxychloroquine Sulfate Tab 200 MG: ORAL | 30 days supply | Qty: 60 | Fill #6 | Status: AC

## 2020-12-30 DIAGNOSIS — E7801 Familial hypercholesterolemia: Secondary | ICD-10-CM | POA: Diagnosis not present

## 2021-01-11 ENCOUNTER — Other Ambulatory Visit (HOSPITAL_COMMUNITY): Payer: Self-pay

## 2021-01-13 ENCOUNTER — Other Ambulatory Visit (HOSPITAL_COMMUNITY): Payer: Self-pay

## 2021-01-13 DIAGNOSIS — Z23 Encounter for immunization: Secondary | ICD-10-CM | POA: Diagnosis not present

## 2021-01-14 ENCOUNTER — Other Ambulatory Visit (HOSPITAL_COMMUNITY): Payer: Self-pay

## 2021-01-14 DIAGNOSIS — Z79899 Other long term (current) drug therapy: Secondary | ICD-10-CM | POA: Diagnosis not present

## 2021-01-14 DIAGNOSIS — M3390 Dermatopolymyositis, unspecified, organ involvement unspecified: Secondary | ICD-10-CM | POA: Diagnosis not present

## 2021-01-14 DIAGNOSIS — L942 Calcinosis cutis: Secondary | ICD-10-CM | POA: Diagnosis not present

## 2021-01-14 MED ORDER — PREDNISONE 5 MG PO TABS
5.0000 mg | ORAL_TABLET | Freq: Every day | ORAL | 3 refills | Status: AC
  Filled 2021-01-14 – 2021-02-07 (×2): qty 90, 90d supply, fill #0
  Filled 2021-05-31: qty 90, 90d supply, fill #1
  Filled 2021-09-01: qty 90, 90d supply, fill #2

## 2021-02-03 ENCOUNTER — Other Ambulatory Visit (HOSPITAL_COMMUNITY): Payer: Self-pay

## 2021-02-03 MED ORDER — BENZONATATE 200 MG PO CAPS
200.0000 mg | ORAL_CAPSULE | Freq: Three times a day (TID) | ORAL | 1 refills | Status: DC | PRN
  Filled 2021-02-03: qty 42, 14d supply, fill #0
  Filled 2021-02-21: qty 42, 14d supply, fill #1

## 2021-02-07 DIAGNOSIS — R052 Subacute cough: Secondary | ICD-10-CM | POA: Diagnosis not present

## 2021-02-07 MED FILL — Hydroxychloroquine Sulfate Tab 200 MG: ORAL | 30 days supply | Qty: 60 | Fill #7 | Status: AC

## 2021-02-08 ENCOUNTER — Other Ambulatory Visit (HOSPITAL_COMMUNITY): Payer: Self-pay

## 2021-02-09 ENCOUNTER — Other Ambulatory Visit (HOSPITAL_COMMUNITY): Payer: Self-pay

## 2021-02-14 ENCOUNTER — Other Ambulatory Visit (HOSPITAL_COMMUNITY): Payer: Self-pay

## 2021-02-17 ENCOUNTER — Other Ambulatory Visit (HOSPITAL_COMMUNITY): Payer: Self-pay

## 2021-02-17 MED ORDER — DOXYCYCLINE HYCLATE 100 MG PO CAPS
100.0000 mg | ORAL_CAPSULE | Freq: Two times a day (BID) | ORAL | 0 refills | Status: AC
  Filled 2021-02-17: qty 14, 7d supply, fill #0

## 2021-02-21 ENCOUNTER — Other Ambulatory Visit (HOSPITAL_COMMUNITY): Payer: Self-pay

## 2021-03-06 ENCOUNTER — Other Ambulatory Visit (HOSPITAL_COMMUNITY): Payer: Self-pay

## 2021-03-07 ENCOUNTER — Other Ambulatory Visit (HOSPITAL_COMMUNITY): Payer: Self-pay

## 2021-03-07 MED ORDER — BENZONATATE 200 MG PO CAPS
200.0000 mg | ORAL_CAPSULE | Freq: Three times a day (TID) | ORAL | 1 refills | Status: AC
  Filled 2021-03-07: qty 42, 14d supply, fill #0

## 2021-03-11 ENCOUNTER — Other Ambulatory Visit (HOSPITAL_BASED_OUTPATIENT_CLINIC_OR_DEPARTMENT_OTHER): Payer: Self-pay

## 2021-03-11 ENCOUNTER — Other Ambulatory Visit (HOSPITAL_COMMUNITY): Payer: Self-pay

## 2021-03-11 MED ORDER — PAXLOVID (300/100) 20 X 150 MG & 10 X 100MG PO TBPK
ORAL_TABLET | ORAL | 0 refills | Status: AC
  Filled 2021-03-11 (×2): qty 30, 5d supply, fill #0

## 2021-03-30 ENCOUNTER — Other Ambulatory Visit (HOSPITAL_COMMUNITY): Payer: Self-pay

## 2021-03-30 MED ORDER — IPRATROPIUM BROMIDE 0.06 % NA SOLN
2.0000 | Freq: Three times a day (TID) | NASAL | 2 refills | Status: AC | PRN
  Filled 2021-03-30: qty 15, 14d supply, fill #0

## 2021-03-30 MED ORDER — FLUTICASONE PROPIONATE 50 MCG/ACT NA SUSP
1.0000 | Freq: Every day | NASAL | 3 refills | Status: AC
  Filled 2021-03-30: qty 16, 60d supply, fill #0

## 2021-03-31 ENCOUNTER — Other Ambulatory Visit (HOSPITAL_COMMUNITY): Payer: Self-pay

## 2021-04-05 ENCOUNTER — Other Ambulatory Visit (HOSPITAL_COMMUNITY): Payer: Self-pay

## 2021-04-22 ENCOUNTER — Other Ambulatory Visit (HOSPITAL_COMMUNITY): Payer: Self-pay

## 2021-04-22 MED FILL — Hydroxychloroquine Sulfate Tab 200 MG: ORAL | 30 days supply | Qty: 60 | Fill #8 | Status: AC

## 2021-04-26 ENCOUNTER — Other Ambulatory Visit: Payer: Self-pay | Admitting: Internal Medicine

## 2021-04-26 DIAGNOSIS — R911 Solitary pulmonary nodule: Secondary | ICD-10-CM

## 2021-05-06 ENCOUNTER — Other Ambulatory Visit (HOSPITAL_COMMUNITY): Payer: Self-pay

## 2021-05-24 ENCOUNTER — Ambulatory Visit
Admission: RE | Admit: 2021-05-24 | Discharge: 2021-05-24 | Disposition: A | Discharge status: Home / Self Care | Payer: Managed Care, Other (non HMO) | Source: Ambulatory Visit | Attending: Internal Medicine | Admitting: Internal Medicine

## 2021-05-24 DIAGNOSIS — R911 Solitary pulmonary nodule: Secondary | ICD-10-CM

## 2021-05-31 ENCOUNTER — Other Ambulatory Visit (HOSPITAL_COMMUNITY): Payer: Self-pay

## 2021-06-07 ENCOUNTER — Other Ambulatory Visit (HOSPITAL_COMMUNITY): Payer: Self-pay

## 2021-06-23 ENCOUNTER — Other Ambulatory Visit (HOSPITAL_COMMUNITY): Payer: Self-pay

## 2021-06-24 ENCOUNTER — Other Ambulatory Visit (HOSPITAL_COMMUNITY): Payer: Self-pay

## 2021-06-24 MED ORDER — HYDROXYCHLOROQUINE SULFATE 200 MG PO TABS
400.0000 mg | ORAL_TABLET | Freq: Every day | ORAL | 11 refills | Status: AC
  Filled 2021-06-24: qty 60, 30d supply, fill #0
  Filled 2021-08-15: qty 60, 30d supply, fill #1

## 2021-07-05 ENCOUNTER — Other Ambulatory Visit (HOSPITAL_COMMUNITY): Payer: Self-pay

## 2021-08-16 ENCOUNTER — Other Ambulatory Visit (HOSPITAL_COMMUNITY): Payer: Self-pay

## 2021-09-01 ENCOUNTER — Other Ambulatory Visit (HOSPITAL_COMMUNITY): Payer: Self-pay

## 2021-09-13 ENCOUNTER — Other Ambulatory Visit (HOSPITAL_COMMUNITY): Payer: Self-pay

## 2021-10-11 ENCOUNTER — Other Ambulatory Visit (HOSPITAL_COMMUNITY): Payer: Self-pay

## 2021-10-28 ENCOUNTER — Other Ambulatory Visit (HOSPITAL_COMMUNITY): Payer: Self-pay

## 2021-10-28 MED ORDER — PREDNISONE 5 MG PO TABS
5.0000 mg | ORAL_TABLET | Freq: Every day | ORAL | 3 refills | Status: DC
  Filled 2021-10-28 – 2021-11-11 (×2): qty 90, 90d supply, fill #0
  Filled 2022-03-11: qty 90, 90d supply, fill #1
  Filled 2022-06-09: qty 90, 90d supply, fill #2
  Filled 2022-09-06: qty 90, 90d supply, fill #3

## 2021-10-28 MED ORDER — METHOTREXATE SODIUM CHEMO INJECTION 50 MG/2ML
25.0000 mg | INTRAMUSCULAR | 3 refills | Status: DC
  Filled 2021-10-28: qty 4, 28d supply, fill #0
  Filled 2021-11-11: qty 12, 84d supply, fill #0
  Filled 2022-01-16 – 2022-01-23 (×2): qty 12, 84d supply, fill #1
  Filled 2022-04-16: qty 12, 84d supply, fill #2
  Filled 2022-07-14: qty 12, 84d supply, fill #3

## 2021-11-11 ENCOUNTER — Other Ambulatory Visit (HOSPITAL_COMMUNITY): Payer: Self-pay

## 2021-11-14 ENCOUNTER — Other Ambulatory Visit (HOSPITAL_COMMUNITY): Payer: Self-pay

## 2022-01-17 ENCOUNTER — Other Ambulatory Visit (HOSPITAL_COMMUNITY): Payer: Self-pay

## 2022-01-24 ENCOUNTER — Other Ambulatory Visit (HOSPITAL_COMMUNITY): Payer: Self-pay

## 2022-03-11 ENCOUNTER — Other Ambulatory Visit: Payer: Self-pay

## 2022-04-17 ENCOUNTER — Other Ambulatory Visit: Payer: Self-pay

## 2022-07-12 ENCOUNTER — Other Ambulatory Visit (HOSPITAL_COMMUNITY): Payer: Self-pay

## 2022-07-12 ENCOUNTER — Other Ambulatory Visit: Payer: Self-pay

## 2022-07-12 MED ORDER — CLENPIQ 10-3.5-12 MG-GM -GM/175ML PO SOLN
ORAL | 0 refills | Status: AC
  Filled 2022-07-12: qty 350, 1d supply, fill #0

## 2022-07-13 ENCOUNTER — Other Ambulatory Visit (HOSPITAL_COMMUNITY): Payer: Self-pay

## 2022-07-17 IMAGING — CT CT CARDIAC CORONARY ARTERY CALCIUM SCORE
3 series · 13 of 20 positions shown, 15 images · non-contrast
Comparison: None

CLINICAL DATA: Hyperlipidemia

EXAM:
CT CARDIAC CORONARY ARTERY CALCIUM SCORE
TECHNIQUE: Non-contrast imaging through the heart was performed using
prospective ECG gating. Image post processing was performed on an
independent workstation, allowing for quantitative analysis of the
heart and coronary arteries. Note that this exam targets the heart
and the chest was not imaged in its entirety.

[Series 2: calcium scoring 2.00 qr36 bestdiast 70% hrt calciu · axial · 0.49mm/px · z∈[+1566,+1622]mm · 3 of 70 slices shown]
[im 14/70  vessel]
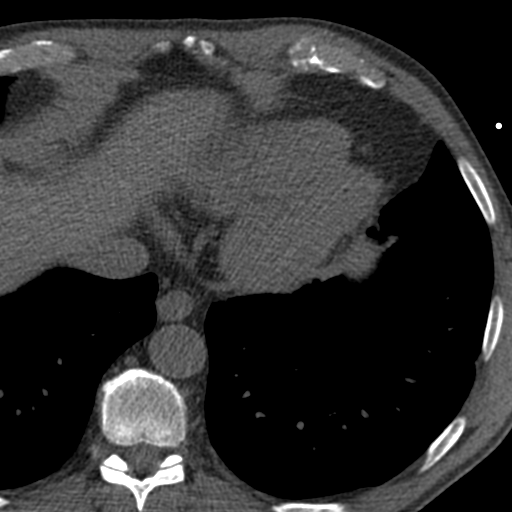
[im 28/70  vessel]
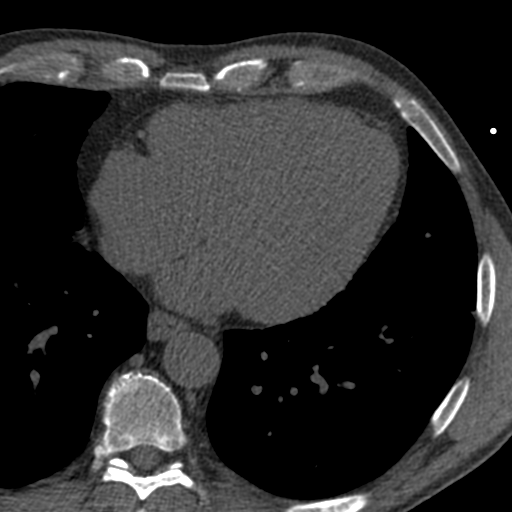
[im 42/70  vessel]
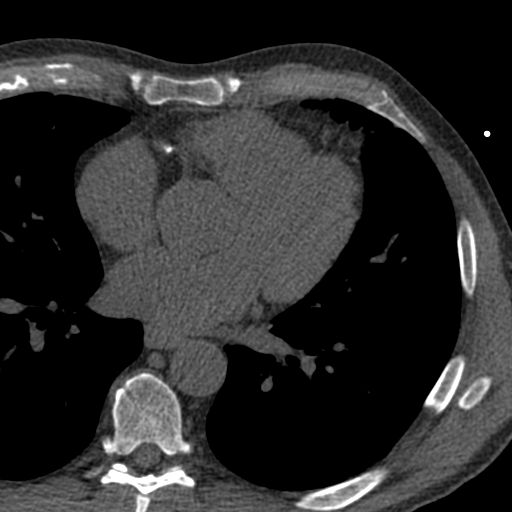

[Series 3: calcium scoring 2.00 br40 bestdiast 70% axial · axial · 0.59mm/px · z∈[+1562,+1654]mm · 5 of 70 slices shown, 7 images]
[im 12/70  vessel]
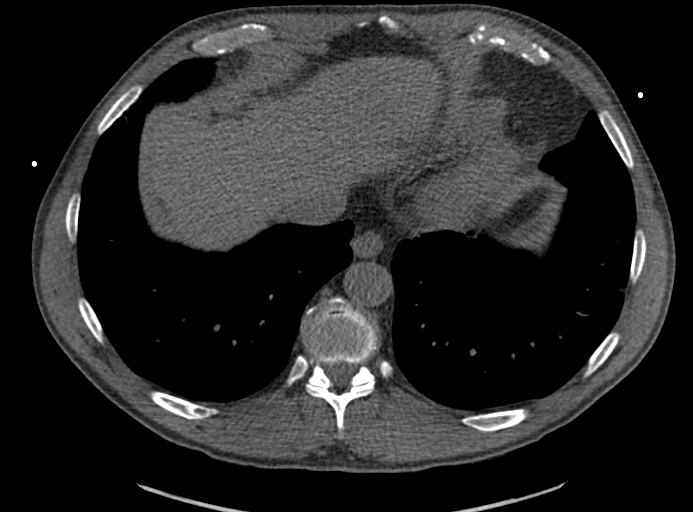
[im 12/70  lung]
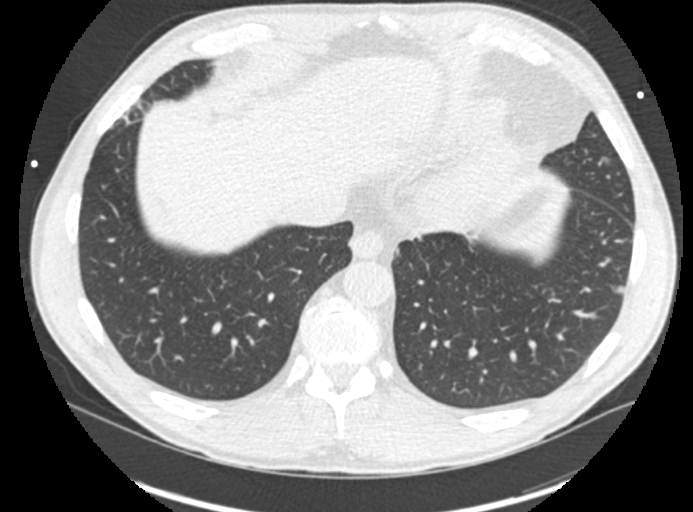
[im 24/70  vessel]
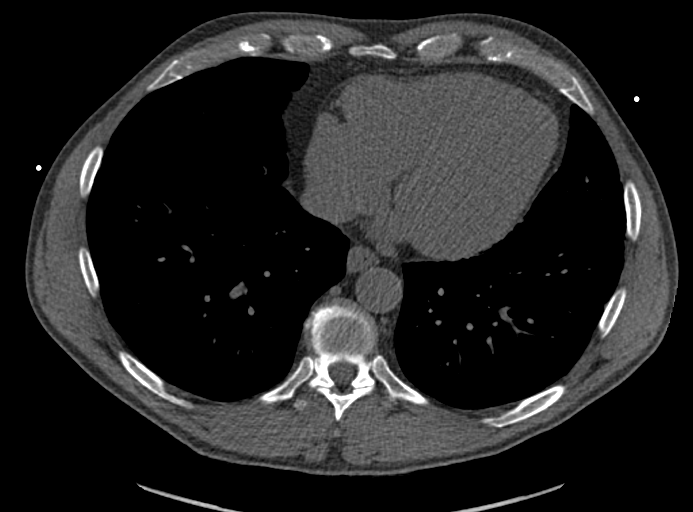
[im 35/70  vessel]
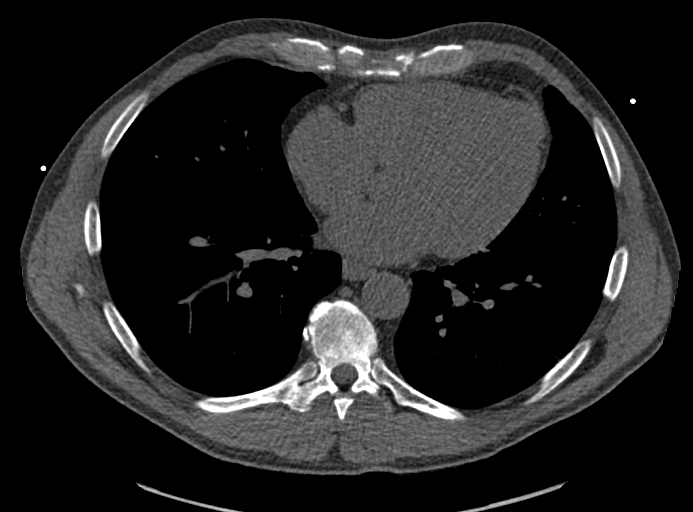
[im 47/70  vessel]
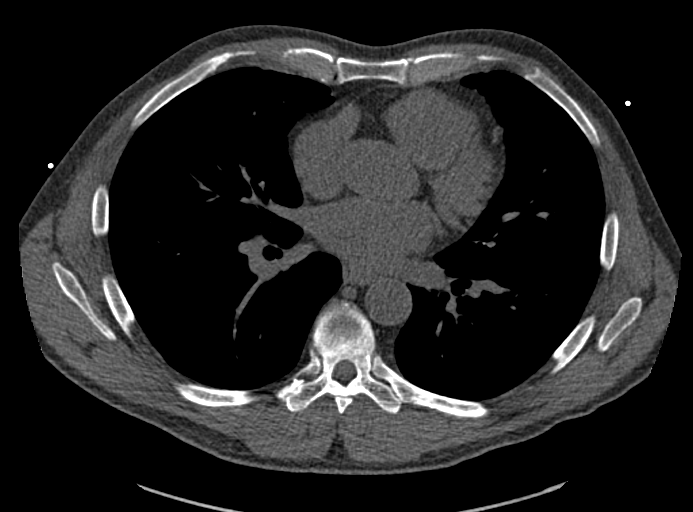
[im 58/70  vessel]
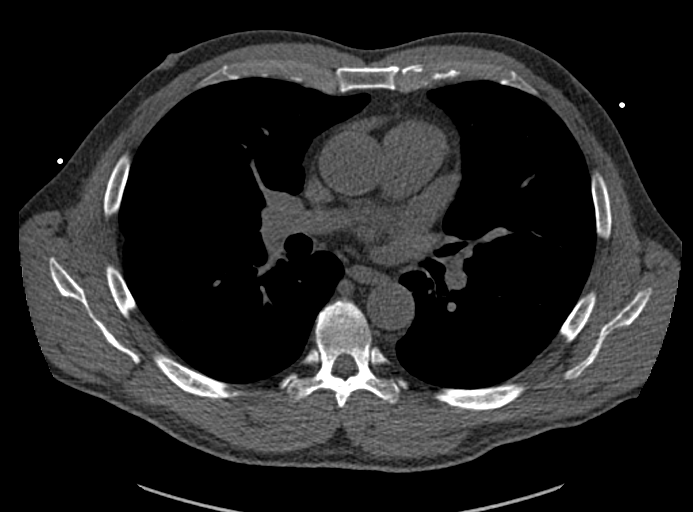
[im 58/70  lung]
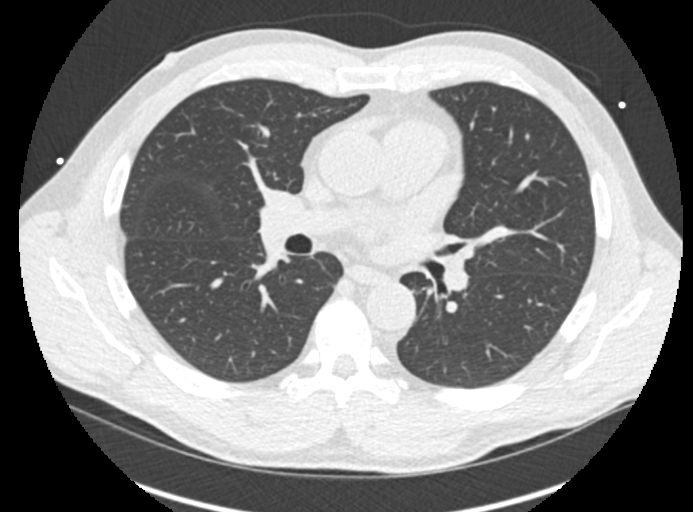

[Series 9: calcium scoring 2.00 br60 bestdiast 70% lungs · axial · 0.59mm/px · z∈[+1566,+1654]mm · 5 of 68 slices shown]
[im 12/68  vessel]
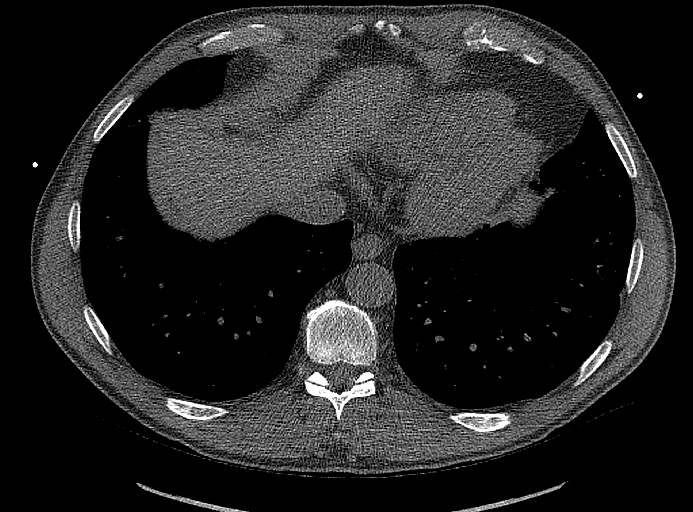
[im 23/68  vessel]
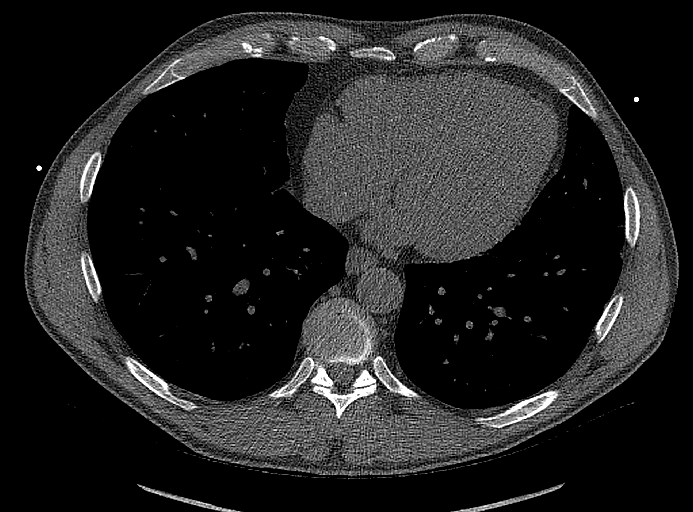
[im 34/68  vessel]
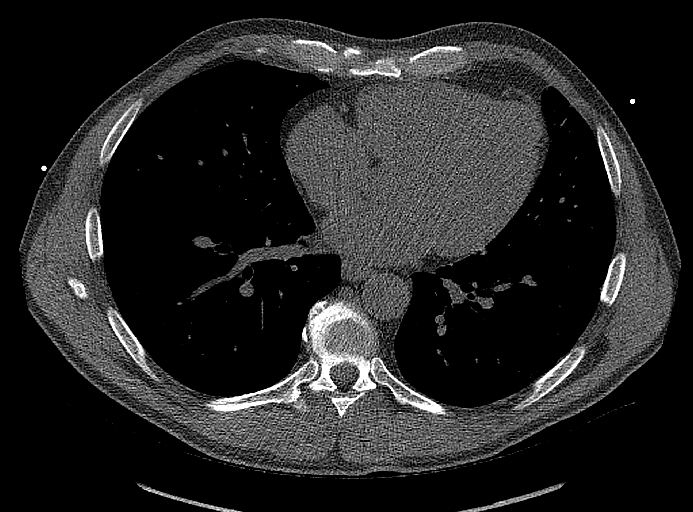
[im 45/68  vessel]
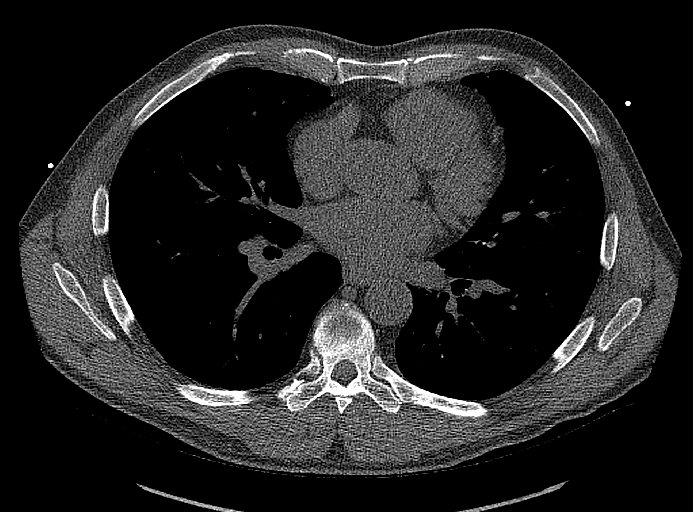
[im 56/68  vessel]
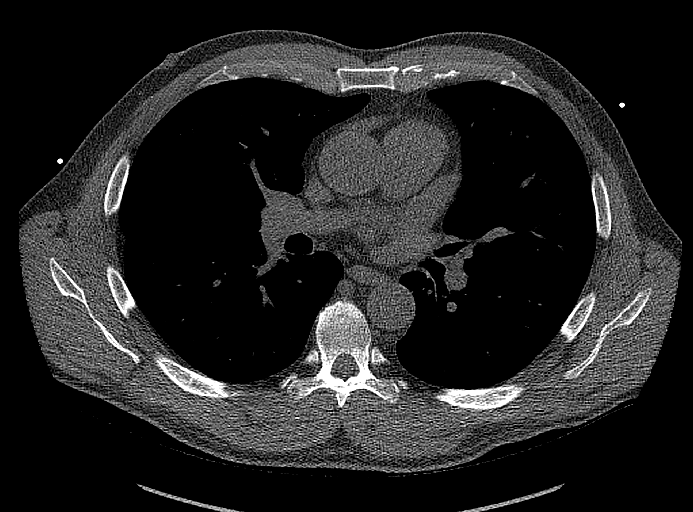

[13 of 20 positions shown; findings below may reference images not displayed]

FINDINGS: CORONARY CALCIUM SCORES:

Left Main: 0

LAD: 282

LCx:

RCA:

Total Agatston Score: 372

[HOSPITAL] percentile: 92

AORTA MEASUREMENTS:

Ascending Aorta: 36 mm

Descending Aorta: 27 mm

OTHER FINDINGS:

Heart is normal size. Aorta normal caliber. No adenopathy. Scarring
in the lingula and right middle lobe. Patchy peripheral opacities in
both lungs. Favor scarring. No effusions. Imaging into the upper
abdomen demonstrates no acute findings. Scattered hypodensities in
the liver, likely cysts although too small to characterize on this
noncontrast study. Chest wall soft tissues are unremarkable. No
acute bony abnormality.
IMPRESSION: Total Agatston score: 372

[HOSPITAL] percentile: 92

Peripheral nodular densities in both lungs, favor areas of scarring.
This could be followed with repeat CT in 6 months to ensure
stability.

Right middle lobe and lingular scarring.

## 2022-08-18 ENCOUNTER — Other Ambulatory Visit (HOSPITAL_COMMUNITY): Payer: Self-pay

## 2022-08-18 MED ORDER — METHOTREXATE SODIUM CHEMO INJECTION 50 MG/2ML
25.0000 mg | INTRAMUSCULAR | 3 refills | Status: AC
  Filled 2022-08-18 – 2022-10-10 (×3): qty 12, 84d supply, fill #0
  Filled 2023-01-15: qty 12, 84d supply, fill #1
  Filled 2023-04-09: qty 12, 84d supply, fill #2
  Filled 2023-06-11: qty 12, 84d supply, fill #3

## 2022-09-06 ENCOUNTER — Other Ambulatory Visit (HOSPITAL_COMMUNITY): Payer: Self-pay

## 2022-10-10 ENCOUNTER — Other Ambulatory Visit (HOSPITAL_COMMUNITY): Payer: Self-pay

## 2022-11-17 ENCOUNTER — Other Ambulatory Visit (HOSPITAL_COMMUNITY): Payer: Self-pay

## 2022-11-17 DIAGNOSIS — M79642 Pain in left hand: Secondary | ICD-10-CM | POA: Diagnosis not present

## 2022-11-17 DIAGNOSIS — R2241 Localized swelling, mass and lump, right lower limb: Secondary | ICD-10-CM | POA: Diagnosis not present

## 2022-11-17 DIAGNOSIS — Z79899 Other long term (current) drug therapy: Secondary | ICD-10-CM | POA: Diagnosis not present

## 2022-11-17 DIAGNOSIS — M1812 Unilateral primary osteoarthritis of first carpometacarpal joint, left hand: Secondary | ICD-10-CM | POA: Diagnosis not present

## 2022-11-17 DIAGNOSIS — M3313 Other dermatomyositis without myopathy: Secondary | ICD-10-CM | POA: Diagnosis not present

## 2022-11-17 DIAGNOSIS — M79641 Pain in right hand: Secondary | ICD-10-CM | POA: Diagnosis not present

## 2022-11-17 MED ORDER — HYDROXYCHLOROQUINE SULFATE 200 MG PO TABS
400.0000 mg | ORAL_TABLET | Freq: Every day | ORAL | 3 refills | Status: AC
  Filled 2022-11-17: qty 180, 90d supply, fill #0
  Filled 2023-02-14: qty 180, 90d supply, fill #1
  Filled 2023-05-16: qty 180, 90d supply, fill #2
  Filled 2023-08-16: qty 180, 90d supply, fill #3

## 2022-12-13 ENCOUNTER — Other Ambulatory Visit (HOSPITAL_COMMUNITY): Payer: Self-pay

## 2022-12-13 MED ORDER — PREDNISONE 5 MG PO TABS
5.0000 mg | ORAL_TABLET | Freq: Every day | ORAL | 3 refills | Status: AC
  Filled 2022-12-13: qty 90, 90d supply, fill #0
  Filled 2023-03-12: qty 90, 90d supply, fill #1
  Filled 2023-06-11: qty 90, 90d supply, fill #2
  Filled 2023-09-07: qty 90, 90d supply, fill #3

## 2022-12-20 DIAGNOSIS — Z0000 Encounter for general adult medical examination without abnormal findings: Secondary | ICD-10-CM | POA: Diagnosis not present

## 2022-12-20 DIAGNOSIS — E039 Hypothyroidism, unspecified: Secondary | ICD-10-CM | POA: Diagnosis not present

## 2022-12-20 DIAGNOSIS — Z125 Encounter for screening for malignant neoplasm of prostate: Secondary | ICD-10-CM | POA: Diagnosis not present

## 2022-12-27 ENCOUNTER — Other Ambulatory Visit (HOSPITAL_COMMUNITY): Payer: Self-pay

## 2022-12-27 DIAGNOSIS — Z0000 Encounter for general adult medical examination without abnormal findings: Secondary | ICD-10-CM | POA: Diagnosis not present

## 2022-12-27 DIAGNOSIS — I2510 Atherosclerotic heart disease of native coronary artery without angina pectoris: Secondary | ICD-10-CM | POA: Diagnosis not present

## 2022-12-27 DIAGNOSIS — M3390 Dermatopolymyositis, unspecified, organ involvement unspecified: Secondary | ICD-10-CM | POA: Diagnosis not present

## 2022-12-27 DIAGNOSIS — J309 Allergic rhinitis, unspecified: Secondary | ICD-10-CM | POA: Diagnosis not present

## 2022-12-27 DIAGNOSIS — Z23 Encounter for immunization: Secondary | ICD-10-CM | POA: Diagnosis not present

## 2022-12-27 DIAGNOSIS — E559 Vitamin D deficiency, unspecified: Secondary | ICD-10-CM | POA: Diagnosis not present

## 2022-12-27 MED ORDER — ROSUVASTATIN CALCIUM 5 MG PO TABS
5.0000 mg | ORAL_TABLET | ORAL | 3 refills | Status: DC
  Filled 2022-12-27: qty 45, 90d supply, fill #0
  Filled 2023-03-12: qty 45, 90d supply, fill #1
  Filled 2023-06-25: qty 45, 90d supply, fill #2
  Filled 2023-09-07: qty 45, 90d supply, fill #3

## 2023-01-01 ENCOUNTER — Other Ambulatory Visit (HOSPITAL_COMMUNITY): Payer: Self-pay

## 2023-01-24 DIAGNOSIS — R2241 Localized swelling, mass and lump, right lower limb: Secondary | ICD-10-CM | POA: Diagnosis not present

## 2023-02-07 DIAGNOSIS — M3390 Dermatopolymyositis, unspecified, organ involvement unspecified: Secondary | ICD-10-CM | POA: Diagnosis not present

## 2023-02-07 DIAGNOSIS — I6523 Occlusion and stenosis of bilateral carotid arteries: Secondary | ICD-10-CM | POA: Diagnosis not present

## 2023-02-08 DIAGNOSIS — E7800 Pure hypercholesterolemia, unspecified: Secondary | ICD-10-CM | POA: Diagnosis not present

## 2023-02-19 DIAGNOSIS — R2241 Localized swelling, mass and lump, right lower limb: Secondary | ICD-10-CM | POA: Diagnosis not present

## 2023-02-19 DIAGNOSIS — M3313 Other dermatomyositis without myopathy: Secondary | ICD-10-CM | POA: Diagnosis not present

## 2023-02-19 DIAGNOSIS — M79641 Pain in right hand: Secondary | ICD-10-CM | POA: Diagnosis not present

## 2023-02-19 DIAGNOSIS — M79642 Pain in left hand: Secondary | ICD-10-CM | POA: Diagnosis not present

## 2023-03-20 DIAGNOSIS — T148XXA Other injury of unspecified body region, initial encounter: Secondary | ICD-10-CM | POA: Diagnosis not present

## 2023-03-20 DIAGNOSIS — C01 Malignant neoplasm of base of tongue: Secondary | ICD-10-CM | POA: Diagnosis not present

## 2023-03-20 DIAGNOSIS — Z08 Encounter for follow-up examination after completed treatment for malignant neoplasm: Secondary | ICD-10-CM | POA: Diagnosis not present

## 2023-03-20 DIAGNOSIS — X58XXXA Exposure to other specified factors, initial encounter: Secondary | ICD-10-CM | POA: Diagnosis not present

## 2023-03-20 DIAGNOSIS — R2241 Localized swelling, mass and lump, right lower limb: Secondary | ICD-10-CM | POA: Diagnosis not present

## 2023-03-20 DIAGNOSIS — Z85818 Personal history of malignant neoplasm of other sites of lip, oral cavity, and pharynx: Secondary | ICD-10-CM | POA: Diagnosis not present

## 2023-03-20 DIAGNOSIS — C770 Secondary and unspecified malignant neoplasm of lymph nodes of head, face and neck: Secondary | ICD-10-CM | POA: Diagnosis not present

## 2023-03-20 DIAGNOSIS — Z87891 Personal history of nicotine dependence: Secondary | ICD-10-CM | POA: Diagnosis not present

## 2023-05-08 DIAGNOSIS — R2241 Localized swelling, mass and lump, right lower limb: Secondary | ICD-10-CM | POA: Diagnosis not present

## 2023-05-08 DIAGNOSIS — T148XXA Other injury of unspecified body region, initial encounter: Secondary | ICD-10-CM | POA: Diagnosis not present

## 2023-05-21 ENCOUNTER — Other Ambulatory Visit (HOSPITAL_COMMUNITY): Payer: Self-pay

## 2023-05-21 DIAGNOSIS — M3313 Other dermatomyositis without myopathy: Secondary | ICD-10-CM | POA: Diagnosis not present

## 2023-05-21 DIAGNOSIS — R2241 Localized swelling, mass and lump, right lower limb: Secondary | ICD-10-CM | POA: Diagnosis not present

## 2023-05-21 DIAGNOSIS — M79641 Pain in right hand: Secondary | ICD-10-CM | POA: Diagnosis not present

## 2023-05-21 DIAGNOSIS — Z79899 Other long term (current) drug therapy: Secondary | ICD-10-CM | POA: Diagnosis not present

## 2023-05-21 MED ORDER — HYDROXYCHLOROQUINE SULFATE 200 MG PO TABS
400.0000 mg | ORAL_TABLET | Freq: Every day | ORAL | 3 refills | Status: AC
  Filled 2023-05-21 – 2023-11-28 (×2): qty 180, 90d supply, fill #0
  Filled 2024-02-21: qty 180, 90d supply, fill #1

## 2023-05-21 MED ORDER — METHOTREXATE SODIUM CHEMO INJECTION 50 MG/2ML
25.0000 mg | INTRAMUSCULAR | 3 refills | Status: AC
  Filled 2023-10-03 (×2): qty 12, 84d supply, fill #0
  Filled 2023-12-24 – 2023-12-25 (×2): qty 12, 84d supply, fill #1
  Filled 2024-03-17: qty 12, 84d supply, fill #2

## 2023-05-21 MED ORDER — PREDNISONE 5 MG PO TABS
5.0000 mg | ORAL_TABLET | Freq: Every day | ORAL | 3 refills | Status: AC
  Filled 2023-12-07: qty 90, 90d supply, fill #0
  Filled 2024-03-11: qty 90, 90d supply, fill #1

## 2023-05-22 DIAGNOSIS — M3313 Other dermatomyositis without myopathy: Secondary | ICD-10-CM | POA: Diagnosis not present

## 2023-05-22 DIAGNOSIS — Z79899 Other long term (current) drug therapy: Secondary | ICD-10-CM | POA: Diagnosis not present

## 2023-06-12 ENCOUNTER — Other Ambulatory Visit (HOSPITAL_COMMUNITY): Payer: Self-pay

## 2023-06-12 ENCOUNTER — Other Ambulatory Visit: Payer: Self-pay

## 2023-06-27 ENCOUNTER — Other Ambulatory Visit (HOSPITAL_COMMUNITY): Payer: Self-pay

## 2023-08-20 ENCOUNTER — Other Ambulatory Visit (HOSPITAL_COMMUNITY): Payer: Self-pay

## 2023-08-30 DIAGNOSIS — R2241 Localized swelling, mass and lump, right lower limb: Secondary | ICD-10-CM | POA: Diagnosis not present

## 2023-08-30 DIAGNOSIS — M3313 Other dermatomyositis without myopathy: Secondary | ICD-10-CM | POA: Diagnosis not present

## 2023-08-30 DIAGNOSIS — Z87891 Personal history of nicotine dependence: Secondary | ICD-10-CM | POA: Diagnosis not present

## 2023-08-30 DIAGNOSIS — M79651 Pain in right thigh: Secondary | ICD-10-CM | POA: Diagnosis not present

## 2023-08-30 DIAGNOSIS — X58XXXA Exposure to other specified factors, initial encounter: Secondary | ICD-10-CM | POA: Diagnosis not present

## 2023-08-30 DIAGNOSIS — T148XXA Other injury of unspecified body region, initial encounter: Secondary | ICD-10-CM | POA: Diagnosis not present

## 2023-09-24 ENCOUNTER — Other Ambulatory Visit (HOSPITAL_COMMUNITY): Payer: Self-pay

## 2023-10-01 ENCOUNTER — Other Ambulatory Visit (HOSPITAL_COMMUNITY): Payer: Self-pay

## 2023-10-03 ENCOUNTER — Other Ambulatory Visit (HOSPITAL_COMMUNITY): Payer: Self-pay

## 2023-10-08 ENCOUNTER — Other Ambulatory Visit (HOSPITAL_COMMUNITY): Payer: Self-pay

## 2023-10-30 DIAGNOSIS — M3313 Other dermatomyositis without myopathy: Secondary | ICD-10-CM | POA: Diagnosis not present

## 2023-10-30 DIAGNOSIS — Z79899 Other long term (current) drug therapy: Secondary | ICD-10-CM | POA: Diagnosis not present

## 2023-11-24 ENCOUNTER — Other Ambulatory Visit (HOSPITAL_COMMUNITY): Payer: Self-pay

## 2023-11-26 ENCOUNTER — Other Ambulatory Visit (HOSPITAL_COMMUNITY): Payer: Self-pay

## 2023-11-26 MED ORDER — ROSUVASTATIN CALCIUM 5 MG PO TABS
5.0000 mg | ORAL_TABLET | ORAL | 0 refills | Status: DC
  Filled 2023-11-26 – 2023-11-30 (×2): qty 45, 90d supply, fill #0

## 2023-11-28 ENCOUNTER — Other Ambulatory Visit (HOSPITAL_COMMUNITY): Payer: Self-pay

## 2023-11-30 ENCOUNTER — Other Ambulatory Visit (HOSPITAL_COMMUNITY): Payer: Self-pay

## 2023-12-02 ENCOUNTER — Other Ambulatory Visit (HOSPITAL_COMMUNITY): Payer: Self-pay

## 2023-12-04 ENCOUNTER — Encounter (HOSPITAL_COMMUNITY): Payer: Self-pay

## 2023-12-04 ENCOUNTER — Other Ambulatory Visit (HOSPITAL_COMMUNITY): Payer: Self-pay

## 2023-12-07 ENCOUNTER — Other Ambulatory Visit (HOSPITAL_COMMUNITY): Payer: Self-pay

## 2023-12-25 ENCOUNTER — Other Ambulatory Visit: Payer: Self-pay

## 2023-12-25 ENCOUNTER — Other Ambulatory Visit (HOSPITAL_COMMUNITY): Payer: Self-pay

## 2023-12-25 DIAGNOSIS — Z125 Encounter for screening for malignant neoplasm of prostate: Secondary | ICD-10-CM | POA: Diagnosis not present

## 2023-12-25 DIAGNOSIS — Z0000 Encounter for general adult medical examination without abnormal findings: Secondary | ICD-10-CM | POA: Diagnosis not present

## 2024-01-01 DIAGNOSIS — M3390 Dermatopolymyositis, unspecified, organ involvement unspecified: Secondary | ICD-10-CM | POA: Diagnosis not present

## 2024-01-01 DIAGNOSIS — E785 Hyperlipidemia, unspecified: Secondary | ICD-10-CM | POA: Diagnosis not present

## 2024-01-01 DIAGNOSIS — I2510 Atherosclerotic heart disease of native coronary artery without angina pectoris: Secondary | ICD-10-CM | POA: Diagnosis not present

## 2024-01-01 DIAGNOSIS — Z0000 Encounter for general adult medical examination without abnormal findings: Secondary | ICD-10-CM | POA: Diagnosis not present

## 2024-01-01 DIAGNOSIS — Z23 Encounter for immunization: Secondary | ICD-10-CM | POA: Diagnosis not present

## 2024-01-21 ENCOUNTER — Other Ambulatory Visit (HOSPITAL_COMMUNITY): Payer: Self-pay

## 2024-01-21 DIAGNOSIS — Z7952 Long term (current) use of systemic steroids: Secondary | ICD-10-CM | POA: Diagnosis not present

## 2024-01-21 DIAGNOSIS — M79641 Pain in right hand: Secondary | ICD-10-CM | POA: Diagnosis not present

## 2024-01-21 DIAGNOSIS — Z79899 Other long term (current) drug therapy: Secondary | ICD-10-CM | POA: Diagnosis not present

## 2024-01-21 DIAGNOSIS — L942 Calcinosis cutis: Secondary | ICD-10-CM | POA: Diagnosis not present

## 2024-01-21 MED ORDER — PREDNISONE 5 MG PO TABS
5.0000 mg | ORAL_TABLET | Freq: Every day | ORAL | 3 refills | Status: AC
  Filled 2024-01-21: qty 90, 90d supply, fill #0

## 2024-01-21 MED ORDER — HYDROXYCHLOROQUINE SULFATE 200 MG PO TABS
400.0000 mg | ORAL_TABLET | Freq: Every day | ORAL | 3 refills | Status: AC
  Filled 2024-01-21: qty 180, 90d supply, fill #0

## 2024-01-22 ENCOUNTER — Other Ambulatory Visit (HOSPITAL_COMMUNITY): Payer: Self-pay

## 2024-01-22 MED ORDER — METHOTREXATE SODIUM CHEMO INJECTION 50 MG/2ML
25.0000 mg | INTRAMUSCULAR | 3 refills | Status: AC
  Filled 2024-01-22: qty 12, 84d supply, fill #0

## 2024-02-04 DIAGNOSIS — M3313 Other dermatomyositis without myopathy: Secondary | ICD-10-CM | POA: Diagnosis not present

## 2024-02-05 ENCOUNTER — Other Ambulatory Visit (HOSPITAL_COMMUNITY): Payer: Self-pay

## 2024-02-07 ENCOUNTER — Other Ambulatory Visit (HOSPITAL_COMMUNITY): Payer: Self-pay

## 2024-03-11 ENCOUNTER — Encounter (HOSPITAL_COMMUNITY): Payer: Self-pay

## 2024-03-11 ENCOUNTER — Other Ambulatory Visit (HOSPITAL_COMMUNITY): Payer: Self-pay

## 2024-03-11 MED ORDER — ROSUVASTATIN CALCIUM 5 MG PO TABS
5.0000 mg | ORAL_TABLET | Freq: Every day | ORAL | 0 refills | Status: AC
  Filled 2024-03-11: qty 45, 90d supply, fill #0

## 2024-04-08 ENCOUNTER — Other Ambulatory Visit (HOSPITAL_COMMUNITY): Payer: Self-pay

## 2024-04-08 MED ORDER — TRIAMCINOLONE ACETONIDE 0.1 % EX CREA
1.0000 | TOPICAL_CREAM | Freq: Two times a day (BID) | CUTANEOUS | 1 refills | Status: AC
  Filled 2024-04-08: qty 15, 30d supply, fill #0

## 2024-04-08 MED ORDER — FLUOROURACIL 5 % EX CREA
1.0000 | TOPICAL_CREAM | Freq: Two times a day (BID) | CUTANEOUS | 1 refills | Status: AC
  Filled 2024-04-08: qty 40, 30d supply, fill #0
